# Patient Record
Sex: Female | Born: 1940 | Race: White | Hispanic: No | Marital: Married | Smoking: Former smoker
Health system: Southern US, Community
[De-identification: ages and names within clinical notes are randomized; demographics above are authoritative.]

## PROBLEM LIST (undated history)

## (undated) DIAGNOSIS — K219 Gastro-esophageal reflux disease without esophagitis: Secondary | ICD-10-CM

## (undated) DIAGNOSIS — I671 Cerebral aneurysm, nonruptured: Secondary | ICD-10-CM

## (undated) DIAGNOSIS — E785 Hyperlipidemia, unspecified: Secondary | ICD-10-CM

## (undated) DIAGNOSIS — G4700 Insomnia, unspecified: Secondary | ICD-10-CM

## (undated) DIAGNOSIS — J449 Chronic obstructive pulmonary disease, unspecified: Secondary | ICD-10-CM

## (undated) DIAGNOSIS — D751 Secondary polycythemia: Secondary | ICD-10-CM

## (undated) HISTORY — PX: TONSILLECTOMY: SUR1361

## (undated) HISTORY — PX: APPENDECTOMY: SHX54

## (undated) HISTORY — PX: BRAIN SURGERY: SHX531

---

## 2004-10-20 HISTORY — PX: BRAIN SURGERY: SHX531

## 2014-10-30 DIAGNOSIS — I671 Cerebral aneurysm, nonruptured: Secondary | ICD-10-CM | POA: Diagnosis not present

## 2014-10-30 DIAGNOSIS — D751 Secondary polycythemia: Secondary | ICD-10-CM | POA: Diagnosis not present

## 2014-10-30 DIAGNOSIS — K219 Gastro-esophageal reflux disease without esophagitis: Secondary | ICD-10-CM | POA: Diagnosis not present

## 2014-10-30 DIAGNOSIS — F5104 Psychophysiologic insomnia: Secondary | ICD-10-CM | POA: Diagnosis not present

## 2014-12-17 DIAGNOSIS — H10012 Acute follicular conjunctivitis, left eye: Secondary | ICD-10-CM | POA: Diagnosis not present

## 2014-12-21 DIAGNOSIS — J0181 Other acute recurrent sinusitis: Secondary | ICD-10-CM | POA: Diagnosis not present

## 2014-12-21 DIAGNOSIS — I671 Cerebral aneurysm, nonruptured: Secondary | ICD-10-CM | POA: Diagnosis not present

## 2014-12-21 DIAGNOSIS — R51 Headache: Secondary | ICD-10-CM | POA: Diagnosis not present

## 2014-12-21 DIAGNOSIS — Z720 Tobacco use: Secondary | ICD-10-CM | POA: Diagnosis not present

## 2015-01-01 ENCOUNTER — Ambulatory Visit: Payer: Self-pay | Admitting: Internal Medicine

## 2015-01-01 DIAGNOSIS — R51 Headache: Secondary | ICD-10-CM | POA: Diagnosis not present

## 2015-01-01 DIAGNOSIS — G501 Atypical facial pain: Secondary | ICD-10-CM | POA: Diagnosis not present

## 2015-01-01 DIAGNOSIS — I679 Cerebrovascular disease, unspecified: Secondary | ICD-10-CM | POA: Diagnosis not present

## 2015-05-15 DIAGNOSIS — F5104 Psychophysiologic insomnia: Secondary | ICD-10-CM | POA: Diagnosis not present

## 2015-05-15 DIAGNOSIS — F329 Major depressive disorder, single episode, unspecified: Secondary | ICD-10-CM | POA: Diagnosis not present

## 2015-05-15 DIAGNOSIS — E780 Pure hypercholesterolemia: Secondary | ICD-10-CM | POA: Diagnosis not present

## 2015-05-15 DIAGNOSIS — Z720 Tobacco use: Secondary | ICD-10-CM | POA: Diagnosis not present

## 2015-05-15 DIAGNOSIS — I671 Cerebral aneurysm, nonruptured: Secondary | ICD-10-CM | POA: Diagnosis not present

## 2015-11-20 DIAGNOSIS — F5104 Psychophysiologic insomnia: Secondary | ICD-10-CM | POA: Diagnosis not present

## 2015-11-20 DIAGNOSIS — Z299 Encounter for prophylactic measures, unspecified: Secondary | ICD-10-CM | POA: Diagnosis not present

## 2015-11-20 DIAGNOSIS — K219 Gastro-esophageal reflux disease without esophagitis: Secondary | ICD-10-CM | POA: Diagnosis not present

## 2015-11-20 DIAGNOSIS — I671 Cerebral aneurysm, nonruptured: Secondary | ICD-10-CM | POA: Diagnosis not present

## 2015-11-20 DIAGNOSIS — F17200 Nicotine dependence, unspecified, uncomplicated: Secondary | ICD-10-CM | POA: Diagnosis not present

## 2015-11-20 DIAGNOSIS — E7800 Pure hypercholesterolemia, unspecified: Secondary | ICD-10-CM | POA: Diagnosis not present

## 2015-11-20 DIAGNOSIS — Z800 Family history of malignant neoplasm of digestive organs: Secondary | ICD-10-CM | POA: Diagnosis not present

## 2016-06-04 DIAGNOSIS — I671 Cerebral aneurysm, nonruptured: Secondary | ICD-10-CM | POA: Diagnosis not present

## 2016-06-04 DIAGNOSIS — F17200 Nicotine dependence, unspecified, uncomplicated: Secondary | ICD-10-CM | POA: Diagnosis not present

## 2016-06-04 DIAGNOSIS — Z299 Encounter for prophylactic measures, unspecified: Secondary | ICD-10-CM | POA: Diagnosis not present

## 2016-06-04 DIAGNOSIS — F5104 Psychophysiologic insomnia: Secondary | ICD-10-CM | POA: Diagnosis not present

## 2016-06-04 DIAGNOSIS — E7800 Pure hypercholesterolemia, unspecified: Secondary | ICD-10-CM | POA: Diagnosis not present

## 2016-06-04 DIAGNOSIS — K219 Gastro-esophageal reflux disease without esophagitis: Secondary | ICD-10-CM | POA: Diagnosis not present

## 2016-06-11 DIAGNOSIS — F17200 Nicotine dependence, unspecified, uncomplicated: Secondary | ICD-10-CM | POA: Diagnosis not present

## 2016-06-11 DIAGNOSIS — I671 Cerebral aneurysm, nonruptured: Secondary | ICD-10-CM | POA: Diagnosis not present

## 2016-06-11 DIAGNOSIS — Z0000 Encounter for general adult medical examination without abnormal findings: Secondary | ICD-10-CM | POA: Diagnosis not present

## 2016-06-11 DIAGNOSIS — K219 Gastro-esophageal reflux disease without esophagitis: Secondary | ICD-10-CM | POA: Diagnosis not present

## 2016-06-11 DIAGNOSIS — F5104 Psychophysiologic insomnia: Secondary | ICD-10-CM | POA: Diagnosis not present

## 2016-06-11 DIAGNOSIS — E782 Mixed hyperlipidemia: Secondary | ICD-10-CM | POA: Diagnosis not present

## 2016-08-15 ENCOUNTER — Encounter: Payer: Self-pay | Admitting: Emergency Medicine

## 2016-08-15 ENCOUNTER — Emergency Department: Payer: Commercial Managed Care - HMO

## 2016-08-15 ENCOUNTER — Emergency Department
Admission: EM | Admit: 2016-08-15 | Discharge: 2016-08-15 | Disposition: A | Discharge status: Home / Self Care | Payer: Commercial Managed Care - HMO | Attending: Student in an Organized Health Care Education/Training Program | Admitting: Student in an Organized Health Care Education/Training Program

## 2016-08-15 DIAGNOSIS — Z79899 Other long term (current) drug therapy: Secondary | ICD-10-CM | POA: Insufficient documentation

## 2016-08-15 DIAGNOSIS — F17210 Nicotine dependence, cigarettes, uncomplicated: Secondary | ICD-10-CM | POA: Insufficient documentation

## 2016-08-15 DIAGNOSIS — N644 Mastodynia: Secondary | ICD-10-CM | POA: Diagnosis not present

## 2016-08-15 DIAGNOSIS — Z7982 Long term (current) use of aspirin: Secondary | ICD-10-CM | POA: Insufficient documentation

## 2016-08-15 DIAGNOSIS — R0789 Other chest pain: Secondary | ICD-10-CM | POA: Diagnosis not present

## 2016-08-15 DIAGNOSIS — R918 Other nonspecific abnormal finding of lung field: Secondary | ICD-10-CM | POA: Diagnosis not present

## 2016-08-15 HISTORY — DX: Gastro-esophageal reflux disease without esophagitis: K21.9

## 2016-08-15 HISTORY — DX: Hyperlipidemia, unspecified: E78.5

## 2016-08-15 LAB — CBC WITH DIFFERENTIAL/PLATELET
BASOS PCT: 0 %
Basophils Absolute: 0.1 10*3/uL (ref 0–0.1)
EOS ABS: 0.1 10*3/uL (ref 0–0.7)
EOS PCT: 1 %
HCT: 52.9 % — ABNORMAL HIGH (ref 35.0–47.0)
Hemoglobin: 18 g/dL — ABNORMAL HIGH (ref 12.0–16.0)
LYMPHS ABS: 2.9 10*3/uL (ref 1.0–3.6)
Lymphocytes Relative: 18 %
MCH: 30.8 pg (ref 26.0–34.0)
MCHC: 34 g/dL (ref 32.0–36.0)
MCV: 90.5 fL (ref 80.0–100.0)
MONO ABS: 1.3 10*3/uL — AB (ref 0.2–0.9)
MONOS PCT: 8 %
NEUTROS PCT: 73 %
Neutro Abs: 11.5 10*3/uL — ABNORMAL HIGH (ref 1.4–6.5)
PLATELETS: 233 10*3/uL (ref 150–440)
RBC: 5.84 MIL/uL — ABNORMAL HIGH (ref 3.80–5.20)
RDW: 14.1 % (ref 11.5–14.5)
WBC: 16 10*3/uL — ABNORMAL HIGH (ref 3.6–11.0)

## 2016-08-15 LAB — COMPREHENSIVE METABOLIC PANEL
ALBUMIN: 3.9 g/dL (ref 3.5–5.0)
ALK PHOS: 63 U/L (ref 38–126)
ALT: 15 U/L (ref 14–54)
ANION GAP: 10 (ref 5–15)
AST: 24 U/L (ref 15–41)
BUN: 18 mg/dL (ref 6–20)
CALCIUM: 9.2 mg/dL (ref 8.9–10.3)
CO2: 23 mmol/L (ref 22–32)
Chloride: 104 mmol/L (ref 101–111)
Creatinine, Ser: 0.92 mg/dL (ref 0.44–1.00)
GFR calc non Af Amer: >60 mL/min (ref >60)
Glucose, Bld: 98 mg/dL (ref 65–99)
POTASSIUM: 4.4 mmol/L (ref 3.5–5.1)
SODIUM: 137 mmol/L (ref 135–145)
Total Bilirubin: 0.6 mg/dL (ref 0.3–1.2)
Total Protein: 7.4 g/dL (ref 6.5–8.1)

## 2016-08-15 LAB — TROPONIN I
Troponin I: <0.03 ng/mL (ref <0.03)
Troponin I: <0.03 ng/mL (ref <0.03)

## 2016-08-15 LAB — FIBRIN DERIVATIVES D-DIMER (ARMC ONLY): FIBRIN DERIVATIVES D-DIMER (ARMC): 405 (ref 0–499)

## 2016-08-15 MED ORDER — OXYCODONE HCL 5 MG PO TABS
5.0000 mg | ORAL_TABLET | ORAL | Status: DC | PRN
  Administered 2016-08-15: 5 mg via ORAL
  Filled 2016-08-15: qty 1

## 2016-08-15 MED ORDER — PROMETHAZINE HCL 25 MG/ML IJ SOLN
INTRAMUSCULAR | Status: AC
  Administered 2016-08-15: 12.5 mg via INTRAVENOUS
  Filled 2016-08-15: qty 1

## 2016-08-15 MED ORDER — TRAMADOL HCL 50 MG PO TABS: 50.0000 mg | ORAL_TABLET | Freq: Four times a day (QID) | ORAL | 0 refills | Status: AC | PRN

## 2016-08-15 MED ORDER — FENTANYL CITRATE (PF) 100 MCG/2ML IJ SOLN
50.0000 ug | INTRAMUSCULAR | Status: DC | PRN
  Administered 2016-08-15 (×2): 50 ug via INTRAVENOUS
  Filled 2016-08-15 (×2): qty 2

## 2016-08-15 MED ORDER — ONDANSETRON HCL 4 MG PO TABS: 4.0000 mg | ORAL_TABLET | Freq: Every day | ORAL | 0 refills | Status: AC | PRN

## 2016-08-15 MED ORDER — IOPAMIDOL (ISOVUE-300) INJECTION 61%
75.0000 mL | Freq: Once | INTRAVENOUS | Status: AC | PRN
  Administered 2016-08-15: 75 mL via INTRAVENOUS

## 2016-08-15 MED ORDER — PROMETHAZINE HCL 25 MG/ML IJ SOLN
12.5000 mg | Freq: Once | INTRAMUSCULAR | Status: AC
  Administered 2016-08-15: 12.5 mg via INTRAVENOUS

## 2016-08-15 MED ORDER — ACETAMINOPHEN 500 MG PO TABS
1000.0000 mg | ORAL_TABLET | Freq: Once | ORAL | Status: AC
  Administered 2016-08-15: 1000 mg via ORAL
  Filled 2016-08-15: qty 2

## 2016-08-15 NOTE — ED Triage Notes (Signed)
Pt c/o right breat pain started this am. States does not feel a lump.

## 2016-08-15 NOTE — ED Provider Notes (Signed)
[facility] Emergency Department Provider Note    First MD Initiated Contact with Patient 08/15/16 [phone]     (approximate)  I have reviewed the triage vital signs and the nursing notes.   HISTORY  Chief Complaint Breast Pain   Dictation #1 [id number]  [id number]:[id number]  HPI Kathryn Houston is a 75 y.o. female who presents with acute chest pain that is located primarily right breast associated with burning and stabbing pain that is waxing and waning and 7/10 in severity since 3 AM this morning. She's never had this type pain before. Denies any nipple redness or discharge. Denies any personal history of breast cancer. Denies any personal history of heart disease. There is no associated shortness of breath. No pain radiating to the back. No abdominal pain or nausea.   Past Medical History:  Diagnosis Date  . GERD (gastroesophageal reflux disease)   . Hyperlipidemia     There are no active problems to display for this patient.   Past Surgical History:  Procedure Laterality Date  . APPENDECTOMY    . BRAIN SURGERY      Prior to Admission medications   Medication Sig Start Date End Date Taking? Authorizing Provider  aspirin EC 81 MG tablet Take 1 tablet by mouth daily.   Yes Historical Provider, MD  CRANBERRY EXTRACT PO Take 1 tablet by mouth daily.   Yes Historical Provider, MD  pantoprazole (PROTONIX) 40 MG tablet Take 1 tablet by mouth 2 (two) times daily as needed. 08/12/16  Yes Historical Provider, MD  pravastatin (PRAVACHOL) 80 MG tablet Take 1 tablet by mouth daily. 08/12/16  Yes Historical Provider, MD  zolpidem (AMBIEN) 10 MG tablet Take 1 tablet by mouth at bedtime. 07/31/16  Yes Historical Provider, MD  ondansetron (ZOFRAN) 4 MG tablet Take 1 tablet (4 mg total) by mouth daily as needed for nausea or vomiting. 08/15/16 08/15/17  Willy EddyPatrick Rubert Frediani, MD  traMADol (ULTRAM) 50 MG tablet Take 1 tablet (50 mg total) by mouth every 6 (six) hours as  needed for severe pain. 08/15/16 08/15/17  Willy EddyPatrick Clair Bardwell, MD    Allergies Review of patient's allergies indicates no known allergies.  FMH:  Father with colonc cnacer  Social History Social History  Substance Use Topics  . Smoking status: Current Some Day Smoker  . Smokeless tobacco: Never Used  . Alcohol use No    Review of Systems Patient denies headaches, rhinorrhea, blurry vision, numbness, shortness of breath, chest pain, edema, cough, abdominal pain, nausea, vomiting, diarrhea, dysuria, fevers, rashes or hallucinations unless otherwise stated above in HPI. ____________________________________________   PHYSICAL EXAM:  VITAL SIGNS: Vitals:   08/15/16 1300 08/15/16 1338  BP: 106/74 133/67  Pulse:  68  Resp: 16 16  Temp:      Constitutional: Alert and oriented. Well appearing and in no acute distress. Eyes: Conjunctivae are normal. PERRL. EOMI. Head: Atraumatic. Nose: No congestion/rhinnorhea. Mouth/Throat: Mucous membranes are moist.  Oropharynx non-erythematous. Neck: No stridor. Painless ROM. No cervical spine tenderness to palpation Hematological/Lymphatic/Immunilogical: No cervical lymphadenopathy. Cardiovascular: Normal rate, regular rhythm. Grossly normal heart sounds.  Good peripheral circulation. Right breath without tender mass or fluctuance, no peau d' orange, no cellulitis. There is an area of tender at 3 o'clock 2cm lateral from nipple Respiratory: Normal respiratory effort.  No retractions. Lungs CTAB. Gastrointestinal: Soft and nontender. No distention. No abdominal bruits. No CVA tenderness. Genitourinary:  Musculoskeletal: No lower extremity tenderness nor edema.  No joint effusions. Neurologic:  Normal speech and  language. No gross focal neurologic deficits are appreciated. No gait instability. Skin:  Skin is warm, dry and intact. No rash noted. Psychiatric: Mood and affect are normal. Speech and behavior are  normal.  ____________________________________________   LABS (all labs ordered are listed, but only abnormal results are displayed)  Results for orders placed or performed during the hospital encounter of 08/15/16 (from the past 24 hour(s))  CBC with Differential/Platelet     Status: Abnormal   Collection Time: 08/15/16  8:18 AM  Result Value Ref Range   WBC 16.0 (H) 3.6 - 11.0 K/uL   RBC 5.84 (H) 3.80 - 5.20 MIL/uL   Hemoglobin 18.0 (H) 12.0 - 16.0 g/dL   HCT 16.1 (H) 09.6 - 04.5 %   MCV 90.5 80.0 - 100.0 fL   MCH 30.8 26.0 - 34.0 pg   MCHC 34.0 32.0 - 36.0 g/dL   RDW 40.9 81.1 - 91.4 %   Platelets 233 150 - 440 K/uL   Neutrophils Relative % 73 %   Neutro Abs 11.5 (H) 1.4 - 6.5 K/uL   Lymphocytes Relative 18 %   Lymphs Abs 2.9 1.0 - 3.6 K/uL   Monocytes Relative 8 %   Monocytes Absolute 1.3 (H) 0.2 - 0.9 K/uL   Eosinophils Relative 1 %   Eosinophils Absolute 0.1 0 - 0.7 K/uL   Basophils Relative 0 %   Basophils Absolute 0.1 0 - 0.1 K/uL  Comprehensive metabolic panel     Status: None   Collection Time: 08/15/16  8:18 AM  Result Value Ref Range   Sodium 137 135 - 145 mmol/L   Potassium 4.4 3.5 - 5.1 mmol/L   Chloride 104 101 - 111 mmol/L   CO2 23 22 - 32 mmol/L   Glucose, Bld 98 65 - 99 mg/dL   BUN 18 6 - 20 mg/dL   Creatinine, Ser 7.82 0.44 - 1.00 mg/dL   Calcium 9.2 8.9 - 95.6 mg/dL   Total Protein 7.4 6.5 - 8.1 g/dL   Albumin 3.9 3.5 - 5.0 g/dL   AST 24 15 - 41 U/L   ALT 15 14 - 54 U/L   Alkaline Phosphatase 63 38 - 126 U/L   Total Bilirubin 0.6 0.3 - 1.2 mg/dL   GFR calc non Af Amer >60 >60 mL/min   GFR calc Af Amer >60 >60 mL/min   Anion gap 10 5 - 15  Troponin I     Status: None   Collection Time: 08/15/16  8:18 AM  Result Value Ref Range   Troponin I <0.03 <0.03 ng/mL  Fibrin derivatives D-Dimer ([facility] only)     Status: None   Collection Time: 08/15/16  8:18 AM  Result Value Ref Range   Fibrin derivatives D-dimer ([facility]) 405 0 - 499  Troponin I      Status: None   Collection Time: 08/15/16 11:30 AM  Result Value Ref Range   Troponin I <0.03 <0.03 ng/mL   ____________________________________________  EKG My review and personal interpretation at Time: 8:14   Indication: chest pain  Rate: 85  Rhythm: sinus3 Axis: normal Other: no acute st changes, poor r wave progression ____________________________________________  RADIOLOGY  I personally reviewed all radiographic images ordered to evaluate for the above acute complaints and reviewed radiology reports and findings.  These findings were personally discussed with the patient.  Please see medical record for radiology report.  ____________________________________________   PROCEDURES  Procedure(s) performed: none    Critical Care performed: no ____________________________________________  INITIAL IMPRESSION / ASSESSMENT AND PLAN / ED COURSE  Pertinent labs & imaging results that were available during my care of the patient were reviewed by me and considered in my medical decision making (see chart for details).  DDX: mass, mastitis, abscess, acs, pe, ptx, pna, msk strain  [address] [place] is a 75 y.o. who presents to the ED with acute right breast pain. She arrives afebrile mildly tachycardic but in no respiratory distress. Based on risk factors above differential has been considered. No evidence of significant breast mass or infection. I am concerned for possible referred pain from cardiac etiology and also possible underlying pulmonary embolism.  Will order laboratory evaluation shows EKG, place patient on monitor, order d-dimer to further risk stratify for PE as the patient is otherwise low risk wells criteria and order CT imaging of the chest to evaluate for for acute pathology.  The patient will be placed on continuous pulse oximetry and telemetry for monitoring.  Laboratory evaluation will be sent to evaluate for the above complaints.  Clinical Course  Comment By  Time  Patient reassessed. Discussed findings with patient. Pain does seem very localized to the right breast. Possible early shingles without rash. Saline no evidence of abscess or cellulitis. She remains afebrile. EKG without evidence of acute ischemia and troponin is negative. Will further risk stratify with repeat troponin. Willy Eddy, MD 10/27 1121  Patient with small amount of emesis after roxicodone. Will provide antiemetic. Willy Eddy, MD 10/27 1236  Patient's repeat troponin came back normal. Her blood work is otherwise reassuring. She remains hemodynamic stable. Did have some improvement with oral Roxicodone. She has no fever. No better explanation for leukocytosis other than possible dehydration the patient is tolerating oral hydration at this time. Do not feel antibiotics clinically indicated at this time as is no evidence of significant cellulitis or abscess. As the patient is otherwise well-appearing and in no distress at this point we will discharge with a symptomatically management and follow-up with primary care provider. Discussed strict return precautions.  Have discussed with the patient and available family all diagnostics and treatments performed thus far and all questions were answered to the best of my ability. The patient demonstrates understanding and agreement with plan.  Willy Eddy, MD 10/27 1302     ____________________________________________   FINAL CLINICAL IMPRESSION(S) / ED DIAGNOSES  Final diagnoses:  Breast pain in female      NEW MEDICATIONS STARTED DURING THIS VISIT:  Discharge Medication List as of 08/15/2016  1:12 PM    START taking these medications   Details  ondansetron (ZOFRAN) 4 MG tablet Take 1 tablet (4 mg total) by mouth daily as needed for nausea or vomiting., Starting Fri 08/15/2016, Until Sat 08/15/2017, Print    traMADol (ULTRAM) 50 MG tablet Take 1 tablet (50 mg total) by mouth every 6 (six) hours as needed for severe  pain., Starting Fri 08/15/2016, Until Sat 08/15/2017, Print         Note:  This document was prepared using Dragon voice recognition software and may include unintentional dictation errors.    Willy Eddy, MD 08/15/16 [phone]

## 2016-08-15 NOTE — ED Notes (Signed)
\  [id number]\

## 2016-08-15 NOTE — ED Notes (Signed)
Right breast pain, burning and stabbing in nature that began at 0300AM today. Pt denies discharge from nipple or redness to skin. Pt alert and oriented X4, active, cooperative, pt in NAD. RR even and unlabored, color WNL.  Denies chest wall pain.

## 2016-12-05 DIAGNOSIS — E782 Mixed hyperlipidemia: Secondary | ICD-10-CM | POA: Diagnosis not present

## 2016-12-05 DIAGNOSIS — Z0000 Encounter for general adult medical examination without abnormal findings: Secondary | ICD-10-CM | POA: Diagnosis not present

## 2016-12-05 DIAGNOSIS — I671 Cerebral aneurysm, nonruptured: Secondary | ICD-10-CM | POA: Diagnosis not present

## 2016-12-05 DIAGNOSIS — F5104 Psychophysiologic insomnia: Secondary | ICD-10-CM | POA: Diagnosis not present

## 2016-12-05 DIAGNOSIS — F17200 Nicotine dependence, unspecified, uncomplicated: Secondary | ICD-10-CM | POA: Diagnosis not present

## 2016-12-05 DIAGNOSIS — K219 Gastro-esophageal reflux disease without esophagitis: Secondary | ICD-10-CM | POA: Diagnosis not present

## 2016-12-15 DIAGNOSIS — E782 Mixed hyperlipidemia: Secondary | ICD-10-CM | POA: Diagnosis not present

## 2016-12-15 DIAGNOSIS — K219 Gastro-esophageal reflux disease without esophagitis: Secondary | ICD-10-CM | POA: Diagnosis not present

## 2016-12-15 DIAGNOSIS — D751 Secondary polycythemia: Secondary | ICD-10-CM | POA: Diagnosis not present

## 2016-12-15 DIAGNOSIS — F17200 Nicotine dependence, unspecified, uncomplicated: Secondary | ICD-10-CM | POA: Diagnosis not present

## 2016-12-15 DIAGNOSIS — F5104 Psychophysiologic insomnia: Secondary | ICD-10-CM | POA: Diagnosis not present

## 2016-12-15 DIAGNOSIS — Z0000 Encounter for general adult medical examination without abnormal findings: Secondary | ICD-10-CM | POA: Diagnosis not present

## 2016-12-15 DIAGNOSIS — I671 Cerebral aneurysm, nonruptured: Secondary | ICD-10-CM | POA: Diagnosis not present

## 2017-01-27 DIAGNOSIS — I671 Cerebral aneurysm, nonruptured: Secondary | ICD-10-CM | POA: Diagnosis not present

## 2017-01-27 DIAGNOSIS — H539 Unspecified visual disturbance: Secondary | ICD-10-CM | POA: Diagnosis not present

## 2017-01-27 DIAGNOSIS — F418 Other specified anxiety disorders: Secondary | ICD-10-CM | POA: Diagnosis not present

## 2017-01-27 DIAGNOSIS — E782 Mixed hyperlipidemia: Secondary | ICD-10-CM | POA: Diagnosis not present

## 2017-01-27 DIAGNOSIS — D751 Secondary polycythemia: Secondary | ICD-10-CM | POA: Diagnosis not present

## 2017-01-28 DIAGNOSIS — H2513 Age-related nuclear cataract, bilateral: Secondary | ICD-10-CM | POA: Diagnosis not present

## 2017-03-23 DIAGNOSIS — I671 Cerebral aneurysm, nonruptured: Secondary | ICD-10-CM | POA: Diagnosis not present

## 2017-03-23 DIAGNOSIS — K219 Gastro-esophageal reflux disease without esophagitis: Secondary | ICD-10-CM | POA: Diagnosis not present

## 2017-03-23 DIAGNOSIS — E782 Mixed hyperlipidemia: Secondary | ICD-10-CM | POA: Diagnosis not present

## 2017-03-23 DIAGNOSIS — F17200 Nicotine dependence, unspecified, uncomplicated: Secondary | ICD-10-CM | POA: Diagnosis not present

## 2017-03-23 DIAGNOSIS — D751 Secondary polycythemia: Secondary | ICD-10-CM | POA: Diagnosis not present

## 2017-10-21 DIAGNOSIS — F17200 Nicotine dependence, unspecified, uncomplicated: Secondary | ICD-10-CM | POA: Diagnosis not present

## 2017-10-21 DIAGNOSIS — E782 Mixed hyperlipidemia: Secondary | ICD-10-CM | POA: Diagnosis not present

## 2017-10-21 DIAGNOSIS — I671 Cerebral aneurysm, nonruptured: Secondary | ICD-10-CM | POA: Diagnosis not present

## 2017-10-21 DIAGNOSIS — K219 Gastro-esophageal reflux disease without esophagitis: Secondary | ICD-10-CM | POA: Diagnosis not present

## 2017-10-21 DIAGNOSIS — D751 Secondary polycythemia: Secondary | ICD-10-CM | POA: Diagnosis not present

## 2017-10-27 DIAGNOSIS — I671 Cerebral aneurysm, nonruptured: Secondary | ICD-10-CM | POA: Diagnosis not present

## 2017-10-27 DIAGNOSIS — K219 Gastro-esophageal reflux disease without esophagitis: Secondary | ICD-10-CM | POA: Diagnosis not present

## 2017-10-27 DIAGNOSIS — E782 Mixed hyperlipidemia: Secondary | ICD-10-CM | POA: Diagnosis not present

## 2017-10-27 DIAGNOSIS — F5104 Psychophysiologic insomnia: Secondary | ICD-10-CM | POA: Diagnosis not present

## 2017-10-27 DIAGNOSIS — Z0000 Encounter for general adult medical examination without abnormal findings: Secondary | ICD-10-CM | POA: Diagnosis not present

## 2017-10-27 DIAGNOSIS — Z87891 Personal history of nicotine dependence: Secondary | ICD-10-CM | POA: Diagnosis not present

## 2018-04-23 DIAGNOSIS — I671 Cerebral aneurysm, nonruptured: Secondary | ICD-10-CM | POA: Diagnosis not present

## 2018-04-23 DIAGNOSIS — K219 Gastro-esophageal reflux disease without esophagitis: Secondary | ICD-10-CM | POA: Diagnosis not present

## 2018-04-23 DIAGNOSIS — Z87891 Personal history of nicotine dependence: Secondary | ICD-10-CM | POA: Diagnosis not present

## 2018-04-23 DIAGNOSIS — E782 Mixed hyperlipidemia: Secondary | ICD-10-CM | POA: Diagnosis not present

## 2018-04-23 DIAGNOSIS — Z0000 Encounter for general adult medical examination without abnormal findings: Secondary | ICD-10-CM | POA: Diagnosis not present

## 2018-04-23 DIAGNOSIS — F5104 Psychophysiologic insomnia: Secondary | ICD-10-CM | POA: Diagnosis not present

## 2018-04-28 DIAGNOSIS — I671 Cerebral aneurysm, nonruptured: Secondary | ICD-10-CM | POA: Diagnosis not present

## 2018-04-28 DIAGNOSIS — K219 Gastro-esophageal reflux disease without esophagitis: Secondary | ICD-10-CM | POA: Diagnosis not present

## 2018-04-28 DIAGNOSIS — E782 Mixed hyperlipidemia: Secondary | ICD-10-CM | POA: Diagnosis not present

## 2018-04-28 DIAGNOSIS — Z0000 Encounter for general adult medical examination without abnormal findings: Secondary | ICD-10-CM | POA: Diagnosis not present

## 2018-04-28 DIAGNOSIS — R399 Unspecified symptoms and signs involving the genitourinary system: Secondary | ICD-10-CM | POA: Diagnosis not present

## 2018-04-28 DIAGNOSIS — D751 Secondary polycythemia: Secondary | ICD-10-CM | POA: Diagnosis not present

## 2018-04-28 DIAGNOSIS — R350 Frequency of micturition: Secondary | ICD-10-CM | POA: Diagnosis not present

## 2018-04-28 DIAGNOSIS — R8290 Unspecified abnormal findings in urine: Secondary | ICD-10-CM | POA: Diagnosis not present

## 2018-04-28 DIAGNOSIS — F5104 Psychophysiologic insomnia: Secondary | ICD-10-CM | POA: Diagnosis not present

## 2018-06-04 DIAGNOSIS — R399 Unspecified symptoms and signs involving the genitourinary system: Secondary | ICD-10-CM | POA: Diagnosis not present

## 2018-06-04 DIAGNOSIS — R8290 Unspecified abnormal findings in urine: Secondary | ICD-10-CM | POA: Diagnosis not present

## 2018-06-04 DIAGNOSIS — E782 Mixed hyperlipidemia: Secondary | ICD-10-CM | POA: Diagnosis not present

## 2018-06-04 DIAGNOSIS — F17200 Nicotine dependence, unspecified, uncomplicated: Secondary | ICD-10-CM | POA: Diagnosis not present

## 2018-06-04 DIAGNOSIS — F5104 Psychophysiologic insomnia: Secondary | ICD-10-CM | POA: Diagnosis not present

## 2018-06-04 DIAGNOSIS — I671 Cerebral aneurysm, nonruptured: Secondary | ICD-10-CM | POA: Diagnosis not present

## 2018-06-04 DIAGNOSIS — D751 Secondary polycythemia: Secondary | ICD-10-CM | POA: Diagnosis not present

## 2018-06-07 DIAGNOSIS — D751 Secondary polycythemia: Secondary | ICD-10-CM | POA: Diagnosis not present

## 2018-06-07 DIAGNOSIS — R399 Unspecified symptoms and signs involving the genitourinary system: Secondary | ICD-10-CM | POA: Diagnosis not present

## 2018-06-08 DIAGNOSIS — R399 Unspecified symptoms and signs involving the genitourinary system: Secondary | ICD-10-CM | POA: Diagnosis not present

## 2018-06-08 DIAGNOSIS — R8290 Unspecified abnormal findings in urine: Secondary | ICD-10-CM | POA: Diagnosis not present

## 2018-06-08 DIAGNOSIS — D751 Secondary polycythemia: Secondary | ICD-10-CM | POA: Diagnosis not present

## 2018-06-13 DIAGNOSIS — D751 Secondary polycythemia: Secondary | ICD-10-CM | POA: Insufficient documentation

## 2018-06-13 NOTE — Progress Notes (Signed)
Kathryn Houston  Telephone:(336) [phone] Fax:(336) [phone]  ID: Kathryn BurnsCamille M Houston OB: May 23, 1941  MR#: [id number]  ION#:[id number]#:[id number]  Patient Care Team: Kathryn ReichmannHande, Vishwanath, MD as PCP - General (Internal Medicine)  CHIEF COMPLAINT: Polycythemia  INTERVAL HISTORY: Patient is a 77 year old female who was noted to have a persistently elevated hemoglobin on routine blood work.  She currently feels well and is asymptomatic.  She has no neurologic complaints.  She denies any recent fevers or illnesses.  She has a good appetite and denies weight loss.  She has no chest pain or shortness of breath.  She denies any nausea, vomiting, constipation, or diarrhea.  She has no urinary complaints.  Patient feels at her baseline offers no specific complaints today.  REVIEW OF SYSTEMS:   Review of Systems  Constitutional: Negative.  Negative for fever, malaise/fatigue and weight loss.  Respiratory: Negative.  Negative for cough, hemoptysis and shortness of breath.   Cardiovascular: Negative.  Negative for chest pain and leg swelling.  Gastrointestinal: Negative.  Negative for abdominal pain.  Genitourinary: Negative.  Negative for dysuria.  Musculoskeletal: Negative.  Negative for back pain.  Skin: Negative.  Negative for rash.  Neurological: Negative.  Negative for focal weakness, weakness and headaches.  Psychiatric/Behavioral: Negative.  The patient is not nervous/anxious.     As per HPI. Otherwise, a complete review of systems is negative.  PAST MEDICAL HISTORY: Past Medical History:  Diagnosis Date  . GERD (gastroesophageal reflux disease)   . Hyperlipidemia     PAST SURGICAL HISTORY: Past Surgical History:  Procedure Laterality Date  . APPENDECTOMY    . BRAIN SURGERY      FAMILY HISTORY: History reviewed. No pertinent family history.  ADVANCED DIRECTIVES (Y/N):  N  HEALTH MAINTENANCE: Social History   Tobacco Use  . Smoking status: Current Some Day Smoker  .  Smokeless tobacco: Never Used  Substance Use Topics  . Alcohol use: No    Frequency: Never  . Drug use: Never     Colonoscopy:  PAP:  Bone density:  Lipid panel:  No Known Allergies  Current Outpatient Medications  Medication Sig Dispense Refill  . aspirin EC 81 MG tablet Take 1 tablet by mouth daily.    Marland Kitchen. CRANBERRY EXTRACT PO Take 1 tablet by mouth daily.    . pantoprazole (PROTONIX) 40 MG tablet Take 1 tablet by mouth 2 (two) times daily as needed.    . pravastatin (PRAVACHOL) 80 MG tablet Take 1 tablet by mouth daily.    Marland Kitchen. zolpidem (AMBIEN) 10 MG tablet Take 1 tablet by mouth at bedtime.     No current facility-administered medications for this visit.     OBJECTIVE: Vitals:   06/15/18 1518 06/15/18 1530  BP:  125/78  Pulse:  79  Resp: 12   Temp:  (!) 97.5 F (36.4 C)     Body mass index is 28.15 kg/m.    ECOG FS:0 - Asymptomatic  General: Well-developed, well-nourished, no acute distress. Eyes: Pink conjunctiva, anicteric sclera. HEENT: Normocephalic, moist mucous membranes, clear oropharnyx. Lungs: Clear to auscultation bilaterally. Heart: Regular rate and rhythm. No rubs, murmurs, or gallops. Abdomen: Soft, nontender, nondistended. No organomegaly noted, normoactive bowel sounds. Musculoskeletal: No edema, cyanosis, or clubbing. Neuro: Alert, answering all questions appropriately. Cranial nerves grossly intact. Skin: No rashes or petechiae noted. Psych: Normal affect. Lymphatics: No cervical, calvicular, axillary or inguinal LAD.   LAB RESULTS:  Lab Results  Component Value Date   NA 137 08/15/2016   K  4.4 08/15/2016   CL 104 08/15/2016   CO2 23 08/15/2016   GLUCOSE 98 08/15/2016   BUN 18 08/15/2016   CREATININE 0.92 08/15/2016   CALCIUM 9.2 08/15/2016   PROT 7.4 08/15/2016   ALBUMIN 3.9 08/15/2016   AST 24 08/15/2016   ALT 15 08/15/2016   ALKPHOS 63 08/15/2016   BILITOT 0.6 08/15/2016   GFRNONAA >60 08/15/2016   GFRAA >60 08/15/2016    Lab  Results  Component Value Date   WBC 10.7 06/15/2018   NEUTROABS 11.5 (H) 08/15/2016   HGB 17.2 (H) 06/15/2018   HCT 50.9 (H) 06/15/2018   MCV 91.3 06/15/2018   PLT 227 06/15/2018     STUDIES: No results found.  ASSESSMENT: Polycythemia  PLAN:    1. 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  Patient has an elevated carbon monoxide level of 7.3%.  Her iron stores, erythropoietin level, and JAK-2 mutation are all either negative or within normal limits.  Hemoglobin has trended down slightly and is now 17.2.  Return to clinic in 1 month with repeat laboratory work and further evaluation.  If her hemoglobin remains greater than 17 at that time, will consider phlebotomy. 2.  Tobacco use: Patient expressed interest in decreasing use and plans to further discuss with her primary care physician.  She also may benefit from referral to [place] lung screening program.  I spent a total of 45 minutes face-to-face with the patient of which greater than 50% of the visit was spent in counseling and coordination of care as detailed above.   Patient expressed understanding and was in agreement with this plan. She also understands that She can call clinic at any time with any questions, concerns, or complaints.   Cancer Staging No matching staging information was found for the patient.  Jeralyn Ruths, MD   06/19/2018 7:30 AM

## 2018-06-15 ENCOUNTER — Encounter (INDEPENDENT_AMBULATORY_CARE_PROVIDER_SITE_OTHER): Payer: Self-pay

## 2018-06-15 ENCOUNTER — Inpatient Hospital Stay: Payer: Medicare HMO

## 2018-06-15 ENCOUNTER — Inpatient Hospital Stay: Payer: Medicare HMO | Attending: Oncology | Admitting: Oncology

## 2018-06-15 ENCOUNTER — Other Ambulatory Visit: Payer: Self-pay

## 2018-06-15 ENCOUNTER — Encounter: Payer: Self-pay | Admitting: Oncology

## 2018-06-15 DIAGNOSIS — D751 Secondary polycythemia: Secondary | ICD-10-CM

## 2018-06-15 DIAGNOSIS — F17210 Nicotine dependence, cigarettes, uncomplicated: Secondary | ICD-10-CM

## 2018-06-15 LAB — CBC
HCT: 50.9 % — ABNORMAL HIGH (ref 35.0–47.0)
HEMOGLOBIN: 17.2 g/dL — AB (ref 12.0–16.0)
MCH: 30.8 pg (ref 26.0–34.0)
MCHC: 33.7 g/dL (ref 32.0–36.0)
MCV: 91.3 fL (ref 80.0–100.0)
PLATELETS: 227 10*3/uL (ref 150–440)
RBC: 5.57 MIL/uL — ABNORMAL HIGH (ref 3.80–5.20)
RDW: 14.5 % (ref 11.5–14.5)
WBC: 10.7 10*3/uL (ref 3.6–11.0)

## 2018-06-15 LAB — IRON AND TIBC
IRON: 69 ug/dL (ref 28–170)
SATURATION RATIOS: 19 % (ref 10.4–31.8)
TIBC: 356 ug/dL (ref 250–450)
UIBC: 287 ug/dL

## 2018-06-15 LAB — FERRITIN: Ferritin: 29 ng/mL (ref 11–307)

## 2018-06-15 NOTE — Progress Notes (Signed)
Patient here for initial visit. She has been feeling light headed and pressure at the base of her skull for about a week. She has also been having a burning feeling in her lower legs.

## 2018-06-16 LAB — ERYTHROPOIETIN: Erythropoietin: 13.6 m[IU]/mL (ref 2.6–18.5)

## 2018-06-16 LAB — CARBON MONOXIDE, BLOOD (PERFORMED AT REF LAB): Carbon Monoxide, Blood: 7.3 % — ABNORMAL HIGH (ref 0.0–3.6)

## 2018-06-18 LAB — JAK2 GENOTYPR

## 2018-06-22 DIAGNOSIS — H2513 Age-related nuclear cataract, bilateral: Secondary | ICD-10-CM | POA: Diagnosis not present

## 2018-06-25 ENCOUNTER — Other Ambulatory Visit: Payer: Self-pay | Admitting: Internal Medicine

## 2018-06-25 DIAGNOSIS — R51 Headache: Principal | ICD-10-CM

## 2018-06-25 DIAGNOSIS — R519 Headache, unspecified: Secondary | ICD-10-CM

## 2018-06-25 DIAGNOSIS — R42 Dizziness and giddiness: Secondary | ICD-10-CM

## 2018-06-26 ENCOUNTER — Ambulatory Visit
Admission: RE | Admit: 2018-06-26 | Discharge: 2018-06-26 | Disposition: A | Discharge status: Home / Self Care | Payer: Medicare HMO | Source: Ambulatory Visit | Attending: Internal Medicine | Admitting: Internal Medicine

## 2018-06-26 DIAGNOSIS — R42 Dizziness and giddiness: Secondary | ICD-10-CM | POA: Diagnosis not present

## 2018-06-26 DIAGNOSIS — R519 Headache, unspecified: Secondary | ICD-10-CM

## 2018-06-26 DIAGNOSIS — R51 Headache: Secondary | ICD-10-CM | POA: Insufficient documentation

## 2018-07-11 NOTE — Progress Notes (Signed)
[facility]  Telephone:(336) [phone] Fax:(336) [phone]  ID: Kathryn BurnsCamille M Houston OB: 1941-09-17  MR#: [id number]  QIO#:[id number]#:[id number]  Patient Care Team: Barbette ReichmannHande, Vishwanath, MD as PCP - General (Internal Medicine)  CHIEF COMPLAINT: Polycythemia, secondary  INTERVAL HISTORY: Patient returns to clinic today for repeat laboratory work and further evaluation.  She continues to smoke heavily, but states that she has cut back significantly over the past month.  She currently feels well and is asymptomatic.  She has no neurologic complaints.  She denies any recent fevers or illnesses.  She has a good appetite and denies weight loss.  She has no chest pain or shortness of breath.  She denies any nausea, vomiting, constipation, or diarrhea.  She has no urinary complaints.  Patient offers no specific complaints today.  REVIEW OF SYSTEMS:   Review of Systems  Constitutional: Negative.  Negative for fever, malaise/fatigue and weight loss.  Respiratory: Negative.  Negative for cough, hemoptysis and shortness of breath.   Cardiovascular: Negative.  Negative for chest pain and leg swelling.  Gastrointestinal: Negative.  Negative for abdominal pain.  Genitourinary: Negative.  Negative for dysuria.  Musculoskeletal: Negative.  Negative for back pain.  Skin: Negative.  Negative for rash.  Neurological: Negative.  Negative for focal weakness, weakness and headaches.  Psychiatric/Behavioral: Negative.  The patient is not nervous/anxious.     As per HPI. Otherwise, a complete review of systems is negative.  PAST MEDICAL HISTORY: Past Medical History:  Diagnosis Date  . GERD (gastroesophageal reflux disease)   . Hyperlipidemia     PAST SURGICAL HISTORY: Past Surgical History:  Procedure Laterality Date  . APPENDECTOMY    . BRAIN SURGERY      FAMILY HISTORY: No family history on file.  ADVANCED DIRECTIVES (Y/N):  N  HEALTH MAINTENANCE: Social History   Tobacco Use  .  Smoking status: Current Some Day Smoker  . Smokeless tobacco: Never Used  Substance Use Topics  . Alcohol use: No    Frequency: Never  . Drug use: Never     Colonoscopy:  PAP:  Bone density:  Lipid panel:  No Known Allergies  Current Outpatient Medications  Medication Sig Dispense Refill  . aspirin EC 81 MG tablet Take 1 tablet by mouth daily.     Marland Kitchen. CRANBERRY EXTRACT PO Take 1 tablet by mouth daily.    . pantoprazole (PROTONIX) 40 MG tablet Take 1 tablet by mouth 2 (two) times daily as needed.    . pravastatin (PRAVACHOL) 80 MG tablet Take 1 tablet by mouth daily.    Marland Kitchen. zolpidem (AMBIEN) 10 MG tablet Take 1 tablet by mouth at bedtime.     No current facility-administered medications for this visit.     OBJECTIVE: Vitals:   07/12/18 1354  BP: 134/84  Pulse: 78  Resp: 18  Temp: (!) 97.4 F (36.3 C)     Body mass index is 28.59 kg/m.    ECOG FS:0 - Asymptomatic  General: Well-developed, well-nourished, no acute distress. Eyes: Pink conjunctiva, anicteric sclera. HEENT: Normocephalic, moist mucous membranes. Lungs: Clear to auscultation bilaterally. Heart: Regular rate and rhythm. No rubs, murmurs, or gallops. Abdomen: Soft, nontender, nondistended. No organomegaly noted, normoactive bowel sounds. Musculoskeletal: No edema, cyanosis, or clubbing. Neuro: Alert, answering all questions appropriately. Cranial nerves grossly intact. Skin: No rashes or petechiae noted. Psych: Normal affect.  LAB RESULTS:  Lab Results  Component Value Date   NA 137 08/15/2016   K 4.4 08/15/2016   CL 104 08/15/2016  CO2 23 08/15/2016   GLUCOSE 98 08/15/2016   BUN 18 08/15/2016   CREATININE 0.92 08/15/2016   CALCIUM 9.2 08/15/2016   PROT 7.4 08/15/2016   ALBUMIN 3.9 08/15/2016   AST 24 08/15/2016   ALT 15 08/15/2016   ALKPHOS 63 08/15/2016   BILITOT 0.6 08/15/2016   GFRNONAA >60 08/15/2016   GFRAA >60 08/15/2016    Lab Results  Component Value Date   WBC 8.9 07/12/2018    NEUTROABS 5.1 07/12/2018   HGB 16.7 (H) 07/12/2018   HCT 49.6 (H) 07/12/2018   MCV 92.4 07/12/2018   PLT 226 07/12/2018     STUDIES: Mr Brain Wo Contrast  Result Date: 06/27/2018 CLINICAL DATA:  Remote aneurysm repair. Head pressure. Symptoms feel the same as at the time of initial aneurysm presentation. Dizziness. EXAM: MRI HEAD WITHOUT CONTRAST TECHNIQUE: Multiplanar, multiecho pulse sequences of the brain and surrounding structures were obtained without intravenous contrast. COMPARISON:  None. FINDINGS: BRAIN: There is no acute infarct, acute hemorrhage or mass effect. The midline structures are normal. There are no old infarcts. Minimal white matter hyperintensity, nonspecific and commonly seen in asymptomatic patients of this age. The CSF spaces are normal for age, with no hydrocephalus. Susceptibility-sensitive sequences show no chronic microhemorrhage or superficial siderosis. VASCULAR: Major intracranial arterial and venous sinus flow voids are preserved. SKULL AND UPPER CERVICAL SPINE: The visualized skull base, calvarium, upper cervical spine and extracranial soft tissues are normal. SINUSES/ORBITS: No fluid levels or advanced mucosal thickening. No mastoid or middle ear effusion. The orbits are normal. IMPRESSION: Normal brain for age. Electronically Signed   By: Deatra Robinson M.D.   On: 06/27/2018 03:27    ASSESSMENT: Polycythemia, secondary  PLAN:    1. 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  Patient has an elevated carbon monoxide level of 7.3%.  Her iron stores, erythropoietin level, and JAK-2 mutation are all either negative or within normal limits.  Patient's hemoglobin is still elevated, but continues to trend down.  This is likely attributed to her decreased tobacco use.  Patient does not require phlebotomy or intervention at this time.  She expressed understanding that she continues to smoke, this may be necessary in the future.  No follow-up has been scheduled at this  time.  Please refer patient back if patient's hemoglobin begins to increase and approaches 18.0.   2.  Tobacco use: Patient reports decreased use.  She has expressed interest in decreasing use and plans to further discuss with her primary care physician.  She also may benefit from referral to CT lung screening program.  I spent a total of 20 minutes face-to-face with the patient of which greater than 50% of the visit was spent in counseling and coordination of care as detailed above.    Patient expressed understanding and was in agreement with this plan. She also understands that She can call clinic at any time with any questions, concerns, or complaints.   Cancer Staging No matching staging information was found for the patient.  Jeralyn Ruths, MD   07/16/2018 12:43 PM

## 2018-07-12 ENCOUNTER — Inpatient Hospital Stay (HOSPITAL_BASED_OUTPATIENT_CLINIC_OR_DEPARTMENT_OTHER): Payer: Medicare HMO | Admitting: Oncology

## 2018-07-12 ENCOUNTER — Other Ambulatory Visit: Payer: Self-pay

## 2018-07-12 ENCOUNTER — Inpatient Hospital Stay: Payer: Medicare HMO

## 2018-07-12 ENCOUNTER — Inpatient Hospital Stay: Payer: Medicare HMO | Attending: Oncology

## 2018-07-12 VITALS — BP 134/84 | HR 78 | Temp 97.4°F | Resp 18 | Wt 151.3 lb

## 2018-07-12 DIAGNOSIS — D751 Secondary polycythemia: Secondary | ICD-10-CM

## 2018-07-12 DIAGNOSIS — F17200 Nicotine dependence, unspecified, uncomplicated: Secondary | ICD-10-CM

## 2018-07-12 LAB — CBC WITH DIFFERENTIAL/PLATELET
BASOS ABS: 0.1 10*3/uL (ref 0–0.1)
Basophils Relative: 1 %
Eosinophils Absolute: 0.1 10*3/uL (ref 0–0.7)
Eosinophils Relative: 1 %
HEMATOCRIT: 49.6 % — AB (ref 35.0–47.0)
HEMOGLOBIN: 16.7 g/dL — AB (ref 12.0–16.0)
LYMPHS PCT: 32 %
Lymphs Abs: 2.8 10*3/uL (ref 1.0–3.6)
MCH: 31 pg (ref 26.0–34.0)
MCHC: 33.6 g/dL (ref 32.0–36.0)
MCV: 92.4 fL (ref 80.0–100.0)
MONO ABS: 0.8 10*3/uL (ref 0.2–0.9)
MONOS PCT: 9 %
NEUTROS ABS: 5.1 10*3/uL (ref 1.4–6.5)
NEUTROS PCT: 57 %
Platelets: 226 10*3/uL (ref 150–440)
RBC: 5.37 MIL/uL — ABNORMAL HIGH (ref 3.80–5.20)
RDW: 14.3 % (ref 11.5–14.5)
WBC: 8.9 10*3/uL (ref 3.6–11.0)

## 2018-07-12 NOTE — Progress Notes (Signed)
Here for follow up. Per pt was feeling " pressure in the top of my head " ( hx of aneurysm )-per pt had stat MRI -finding negative. Pt started taking asa 81 mg x 2 per day. Sees Dr Kenard GowerHunday in 2 w-will consult w him then about this .

## 2018-07-19 ENCOUNTER — Other Ambulatory Visit: Payer: Self-pay

## 2018-07-19 ENCOUNTER — Ambulatory Visit: Payer: Self-pay | Admitting: Oncology

## 2018-08-03 DIAGNOSIS — R399 Unspecified symptoms and signs involving the genitourinary system: Secondary | ICD-10-CM | POA: Diagnosis not present

## 2018-08-03 DIAGNOSIS — R350 Frequency of micturition: Secondary | ICD-10-CM | POA: Diagnosis not present

## 2018-08-03 DIAGNOSIS — D751 Secondary polycythemia: Secondary | ICD-10-CM | POA: Diagnosis not present

## 2018-08-03 DIAGNOSIS — R8290 Unspecified abnormal findings in urine: Secondary | ICD-10-CM | POA: Diagnosis not present

## 2018-08-04 DIAGNOSIS — K219 Gastro-esophageal reflux disease without esophagitis: Secondary | ICD-10-CM | POA: Diagnosis not present

## 2018-08-04 DIAGNOSIS — F17200 Nicotine dependence, unspecified, uncomplicated: Secondary | ICD-10-CM | POA: Diagnosis not present

## 2018-08-04 DIAGNOSIS — F5104 Psychophysiologic insomnia: Secondary | ICD-10-CM | POA: Diagnosis not present

## 2018-08-04 DIAGNOSIS — I671 Cerebral aneurysm, nonruptured: Secondary | ICD-10-CM | POA: Diagnosis not present

## 2018-08-04 DIAGNOSIS — D751 Secondary polycythemia: Secondary | ICD-10-CM | POA: Diagnosis not present

## 2018-08-04 DIAGNOSIS — E782 Mixed hyperlipidemia: Secondary | ICD-10-CM | POA: Diagnosis not present

## 2019-01-03 DIAGNOSIS — I671 Cerebral aneurysm, nonruptured: Secondary | ICD-10-CM | POA: Diagnosis not present

## 2019-01-03 DIAGNOSIS — F5104 Psychophysiologic insomnia: Secondary | ICD-10-CM | POA: Diagnosis not present

## 2019-01-03 DIAGNOSIS — F17200 Nicotine dependence, unspecified, uncomplicated: Secondary | ICD-10-CM | POA: Diagnosis not present

## 2019-01-03 DIAGNOSIS — Z780 Asymptomatic menopausal state: Secondary | ICD-10-CM | POA: Diagnosis not present

## 2019-01-03 DIAGNOSIS — E782 Mixed hyperlipidemia: Secondary | ICD-10-CM | POA: Diagnosis not present

## 2019-01-03 DIAGNOSIS — Z0000 Encounter for general adult medical examination without abnormal findings: Secondary | ICD-10-CM | POA: Diagnosis not present

## 2019-01-03 DIAGNOSIS — K219 Gastro-esophageal reflux disease without esophagitis: Secondary | ICD-10-CM | POA: Diagnosis not present

## 2019-04-15 DIAGNOSIS — M810 Age-related osteoporosis without current pathological fracture: Secondary | ICD-10-CM | POA: Diagnosis not present

## 2019-05-30 DIAGNOSIS — R457 State of emotional shock and stress, unspecified: Secondary | ICD-10-CM | POA: Diagnosis not present

## 2019-05-30 DIAGNOSIS — E782 Mixed hyperlipidemia: Secondary | ICD-10-CM | POA: Diagnosis not present

## 2019-05-30 DIAGNOSIS — I671 Cerebral aneurysm, nonruptured: Secondary | ICD-10-CM | POA: Diagnosis not present

## 2019-05-30 DIAGNOSIS — F5104 Psychophysiologic insomnia: Secondary | ICD-10-CM | POA: Diagnosis not present

## 2019-05-30 DIAGNOSIS — F3340 Major depressive disorder, recurrent, in remission, unspecified: Secondary | ICD-10-CM | POA: Diagnosis not present

## 2019-05-30 DIAGNOSIS — K219 Gastro-esophageal reflux disease without esophagitis: Secondary | ICD-10-CM | POA: Diagnosis not present

## 2019-05-30 DIAGNOSIS — F17200 Nicotine dependence, unspecified, uncomplicated: Secondary | ICD-10-CM | POA: Diagnosis not present

## 2019-09-29 DIAGNOSIS — E782 Mixed hyperlipidemia: Secondary | ICD-10-CM | POA: Diagnosis not present

## 2019-09-29 DIAGNOSIS — R457 State of emotional shock and stress, unspecified: Secondary | ICD-10-CM | POA: Diagnosis not present

## 2019-09-29 DIAGNOSIS — F17200 Nicotine dependence, unspecified, uncomplicated: Secondary | ICD-10-CM | POA: Diagnosis not present

## 2019-09-29 DIAGNOSIS — F5104 Psychophysiologic insomnia: Secondary | ICD-10-CM | POA: Diagnosis not present

## 2019-09-29 DIAGNOSIS — I671 Cerebral aneurysm, nonruptured: Secondary | ICD-10-CM | POA: Diagnosis not present

## 2019-09-29 DIAGNOSIS — F3340 Major depressive disorder, recurrent, in remission, unspecified: Secondary | ICD-10-CM | POA: Diagnosis not present

## 2019-09-29 DIAGNOSIS — K219 Gastro-esophageal reflux disease without esophagitis: Secondary | ICD-10-CM | POA: Diagnosis not present

## 2019-10-03 DIAGNOSIS — Z79899 Other long term (current) drug therapy: Secondary | ICD-10-CM | POA: Diagnosis not present

## 2019-10-03 DIAGNOSIS — I10 Essential (primary) hypertension: Secondary | ICD-10-CM | POA: Diagnosis not present

## 2019-10-03 DIAGNOSIS — Z85841 Personal history of malignant neoplasm of brain: Secondary | ICD-10-CM | POA: Diagnosis not present

## 2019-10-03 DIAGNOSIS — M810 Age-related osteoporosis without current pathological fracture: Secondary | ICD-10-CM | POA: Diagnosis not present

## 2019-10-03 DIAGNOSIS — K219 Gastro-esophageal reflux disease without esophagitis: Secondary | ICD-10-CM | POA: Diagnosis not present

## 2019-10-03 DIAGNOSIS — E785 Hyperlipidemia, unspecified: Secondary | ICD-10-CM | POA: Diagnosis not present

## 2019-10-03 DIAGNOSIS — Z0000 Encounter for general adult medical examination without abnormal findings: Secondary | ICD-10-CM | POA: Diagnosis not present

## 2019-10-03 DIAGNOSIS — R42 Dizziness and giddiness: Secondary | ICD-10-CM | POA: Diagnosis not present

## 2019-10-03 DIAGNOSIS — D751 Secondary polycythemia: Secondary | ICD-10-CM | POA: Diagnosis not present

## 2020-01-24 DIAGNOSIS — F17200 Nicotine dependence, unspecified, uncomplicated: Secondary | ICD-10-CM | POA: Diagnosis not present

## 2020-01-24 DIAGNOSIS — K219 Gastro-esophageal reflux disease without esophagitis: Secondary | ICD-10-CM | POA: Diagnosis not present

## 2020-01-24 DIAGNOSIS — I10 Essential (primary) hypertension: Secondary | ICD-10-CM | POA: Diagnosis not present

## 2020-01-24 DIAGNOSIS — D751 Secondary polycythemia: Secondary | ICD-10-CM | POA: Diagnosis not present

## 2020-01-24 DIAGNOSIS — M810 Age-related osteoporosis without current pathological fracture: Secondary | ICD-10-CM | POA: Diagnosis not present

## 2020-01-24 DIAGNOSIS — E785 Hyperlipidemia, unspecified: Secondary | ICD-10-CM | POA: Diagnosis not present

## 2020-01-24 DIAGNOSIS — R42 Dizziness and giddiness: Secondary | ICD-10-CM | POA: Diagnosis not present

## 2020-01-24 DIAGNOSIS — M25561 Pain in right knee: Secondary | ICD-10-CM | POA: Diagnosis not present

## 2020-01-24 DIAGNOSIS — F5104 Psychophysiologic insomnia: Secondary | ICD-10-CM | POA: Diagnosis not present

## 2020-01-25 DIAGNOSIS — M25561 Pain in right knee: Secondary | ICD-10-CM | POA: Diagnosis not present

## 2020-01-27 DIAGNOSIS — I671 Cerebral aneurysm, nonruptured: Secondary | ICD-10-CM | POA: Diagnosis not present

## 2020-01-27 DIAGNOSIS — E782 Mixed hyperlipidemia: Secondary | ICD-10-CM | POA: Diagnosis not present

## 2020-01-27 DIAGNOSIS — Z0000 Encounter for general adult medical examination without abnormal findings: Secondary | ICD-10-CM | POA: Diagnosis not present

## 2020-01-27 DIAGNOSIS — H8110 Benign paroxysmal vertigo, unspecified ear: Secondary | ICD-10-CM | POA: Diagnosis not present

## 2020-01-27 DIAGNOSIS — Z79899 Other long term (current) drug therapy: Secondary | ICD-10-CM | POA: Diagnosis not present

## 2020-01-27 DIAGNOSIS — K219 Gastro-esophageal reflux disease without esophagitis: Secondary | ICD-10-CM | POA: Diagnosis not present

## 2020-01-27 DIAGNOSIS — D751 Secondary polycythemia: Secondary | ICD-10-CM | POA: Diagnosis not present

## 2020-01-27 DIAGNOSIS — R8290 Unspecified abnormal findings in urine: Secondary | ICD-10-CM | POA: Diagnosis not present

## 2020-01-27 DIAGNOSIS — F17200 Nicotine dependence, unspecified, uncomplicated: Secondary | ICD-10-CM | POA: Diagnosis not present

## 2020-01-27 DIAGNOSIS — F5104 Psychophysiologic insomnia: Secondary | ICD-10-CM | POA: Diagnosis not present

## 2020-02-01 DIAGNOSIS — F17210 Nicotine dependence, cigarettes, uncomplicated: Secondary | ICD-10-CM | POA: Diagnosis not present

## 2020-02-01 DIAGNOSIS — F5104 Psychophysiologic insomnia: Secondary | ICD-10-CM | POA: Diagnosis not present

## 2020-02-01 DIAGNOSIS — M25561 Pain in right knee: Secondary | ICD-10-CM | POA: Diagnosis not present

## 2020-02-01 DIAGNOSIS — Z79899 Other long term (current) drug therapy: Secondary | ICD-10-CM | POA: Diagnosis not present

## 2020-02-01 DIAGNOSIS — E785 Hyperlipidemia, unspecified: Secondary | ICD-10-CM | POA: Diagnosis not present

## 2020-02-01 DIAGNOSIS — D751 Secondary polycythemia: Secondary | ICD-10-CM | POA: Diagnosis not present

## 2020-02-01 DIAGNOSIS — I10 Essential (primary) hypertension: Secondary | ICD-10-CM | POA: Diagnosis not present

## 2020-02-01 DIAGNOSIS — Z0000 Encounter for general adult medical examination without abnormal findings: Secondary | ICD-10-CM | POA: Diagnosis not present

## 2020-02-01 DIAGNOSIS — H8110 Benign paroxysmal vertigo, unspecified ear: Secondary | ICD-10-CM | POA: Diagnosis not present

## 2020-02-01 DIAGNOSIS — K219 Gastro-esophageal reflux disease without esophagitis: Secondary | ICD-10-CM | POA: Diagnosis not present

## 2020-02-12 IMAGING — MR MR HEAD W/O CM
12 of 13 series · 40 of 48 positions shown · non-contrast
Comparison: None.

CLINICAL DATA: Remote aneurysm repair. Head pressure. Symptoms feel
the same as at the time of initial aneurysm presentation. Dizziness.

EXAM:
MRI HEAD WITHOUT CONTRAST
TECHNIQUE: Multiplanar, multiecho pulse sequences of the brain and surrounding
structures were obtained without intravenous contrast.

[Series 5: ax dwi_tracew · axial · 3.0mm · 0.73mm/px · z∈[-89,+72]mm · 3 of 55 slices shown (1 of 2)]
[im 1/55]
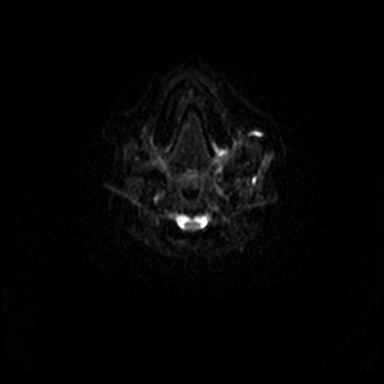
[im 28/55]
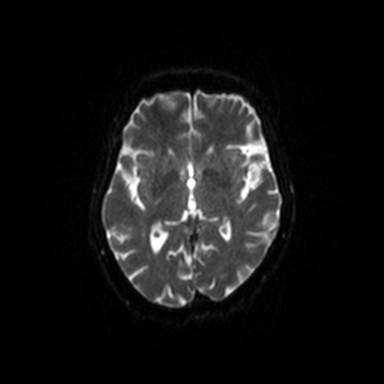
[im 55/55]
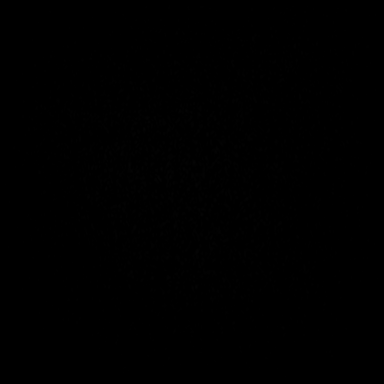

[Series 5: ax dwi_tracew · axial · 3.0mm · 0.73mm/px · z∈[-89,+72]mm · 3 of 54 slices shown (2 of 2)]
[im 1/54]
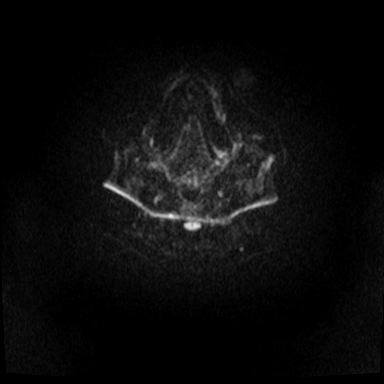
[im 27/54]
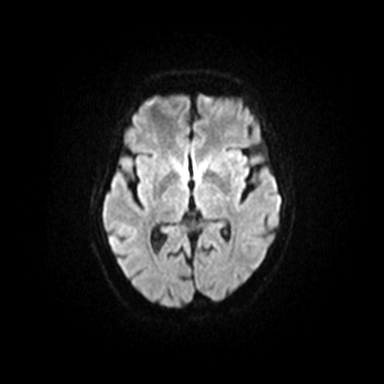
[im 54/54]
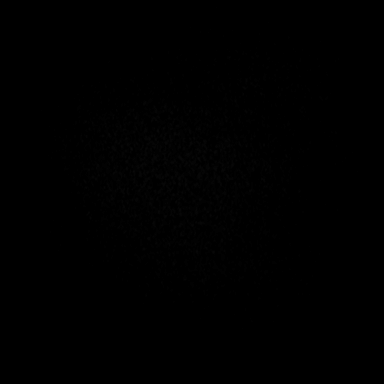

[Series 6: ax dwi_adc · axial · 3.0mm · 0.73mm/px · z∈[-89,+66]mm · 3 of 53 slices shown]
[im 1/53]
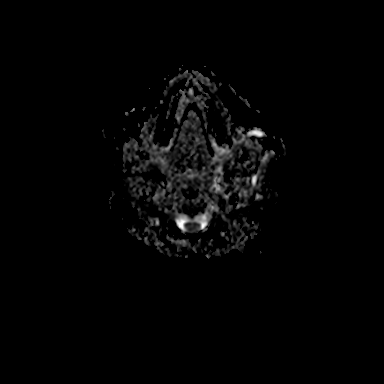
[im 27/53]
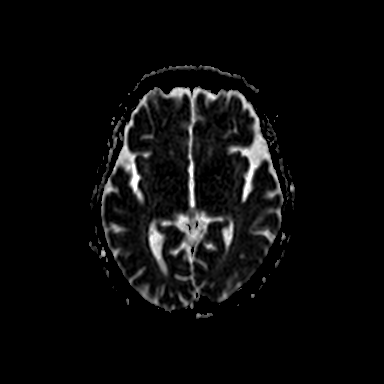
[im 53/53]
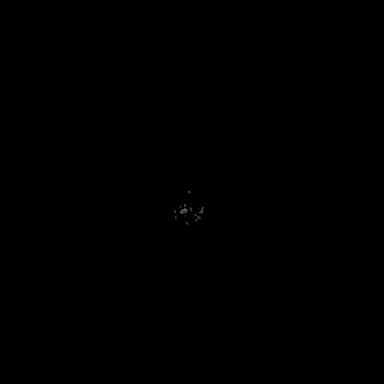

[Series 7: cor dwi_tracew · coronal · 5.0mm · 0.60mm/px · 3 of 40 slices shown (1 of 2)]
[im 1/40]
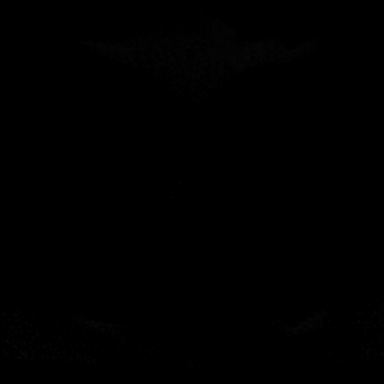
[im 20/40]
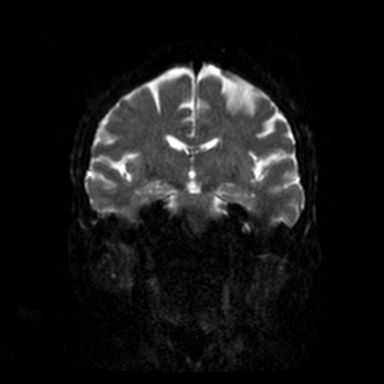
[im 40/40]
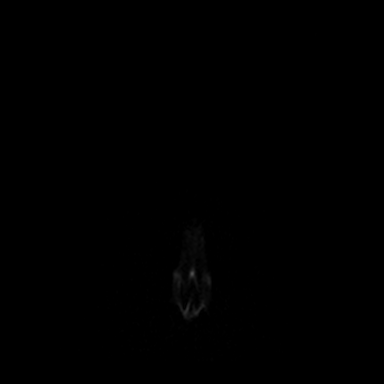

[Series 7: cor dwi_tracew · coronal · 5.0mm · 0.60mm/px · 3 of 40 slices shown (2 of 2)]
[im 1/40]
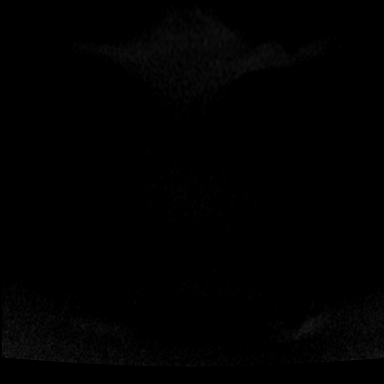
[im 20/40]
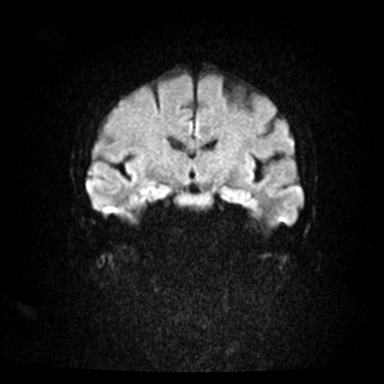
[im 40/40]
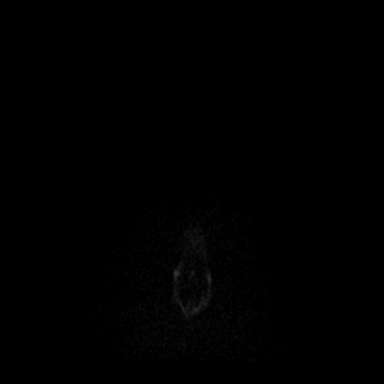

[Series 8: cor dwi_adc · coronal · 5.0mm · 0.60mm/px · 3 of 39 slices shown]
[im 1/39]
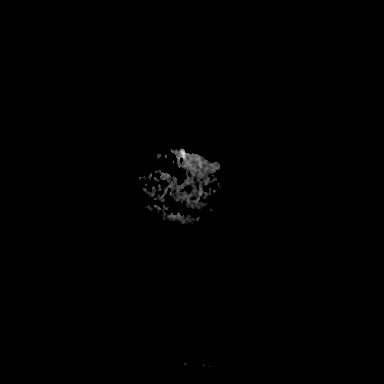
[im 20/39]
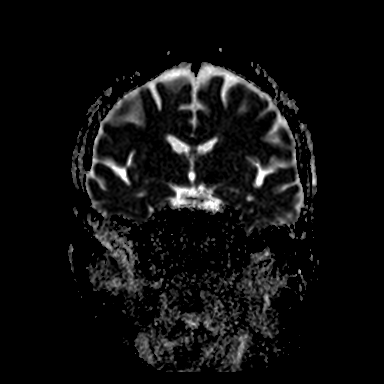
[im 39/39]
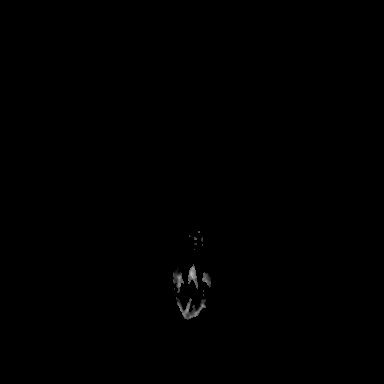

[Series 9: T1 · sagittal · 5.0mm · 0.62mm/px · 2 of 23 slices shown (1 of 2)]
[im 1/23]
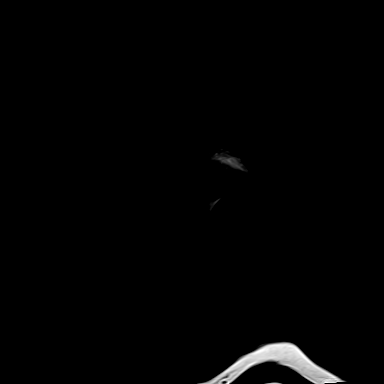
[im 23/23]
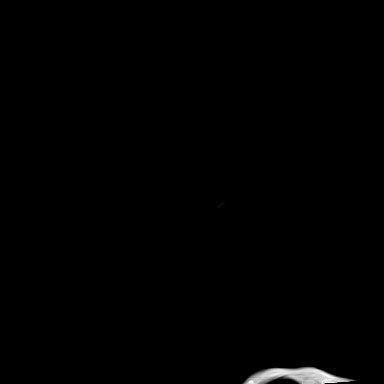

[Series 10: T2 · axial · 5.0mm · 0.53mm/px · z∈[-80,+64]mm · 2 of 25 slices shown (1 of 2)]
[im 1/25]
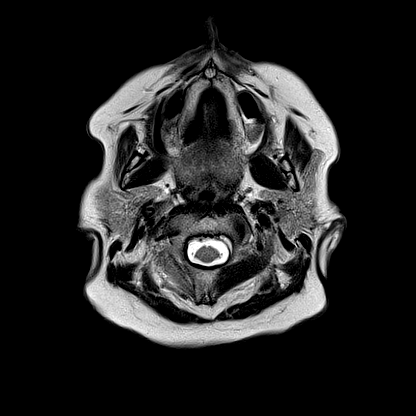
[im 25/25]
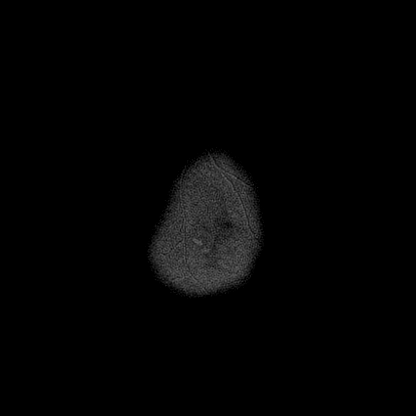

[Series 11: swi_images · axial · 3.0mm · 0.90mm/px · z∈[-96,+81]mm · 4 of 60 slices shown]
[im 1/60]
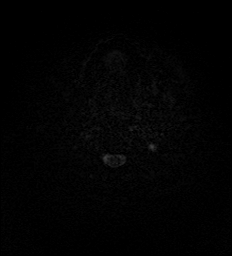
[im 20/60]
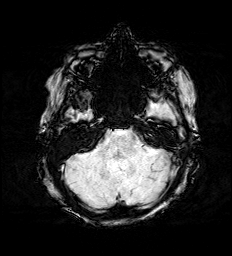
[im 40/60]
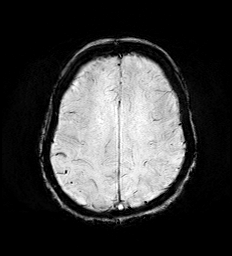
[im 60/60]
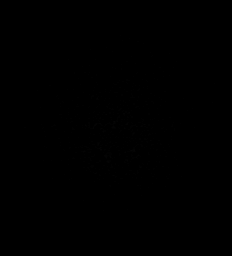

[Series 13: FLAIR · axial · 3.0mm · 0.53mm/px · z∈[-89,+73]mm · 4 of 55 slices shown]
[im 1/55]
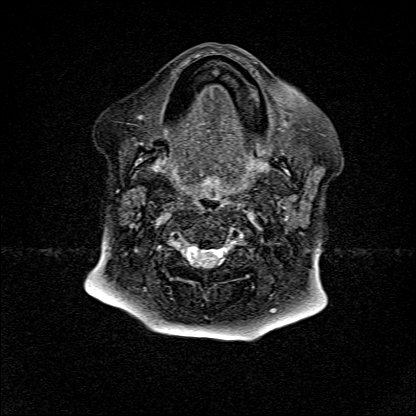
[im 19/55]
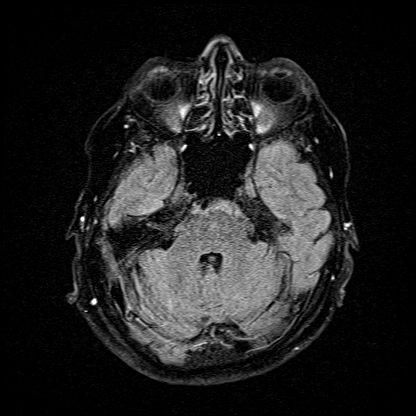
[im 37/55]
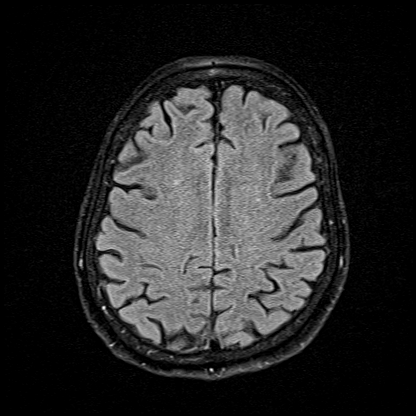
[im 55/55]
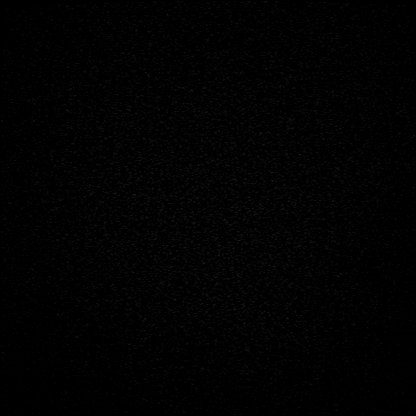

[Series 14: T1 · axial · 1.0mm · 0.98mm/px · z∈[-95,+79]mm · 8 of 176 slices shown (2 of 2)]
[im 1/176]
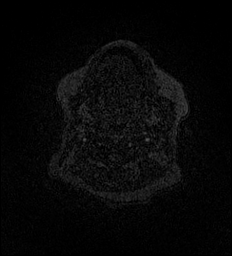
[im 32/176]
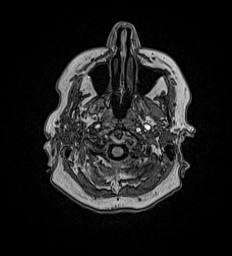
[im 48/176]
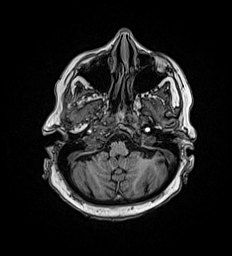
[im 80/176]
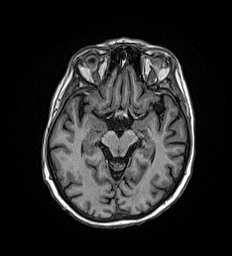
[im 96/176]
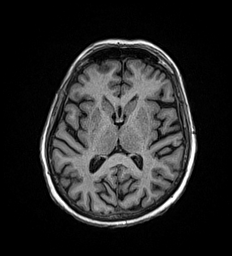
[im 128/176]
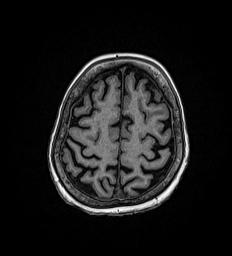
[im 144/176]
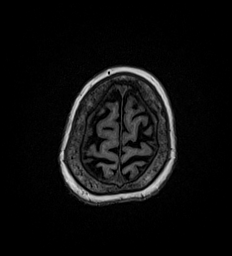
[im 176/176]
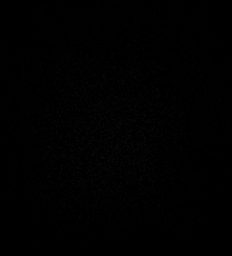

[Series 15: T2 · coronal · 5.0mm · 0.57mm/px · 2 of 29 slices shown (2 of 2)]
[im 1/29]
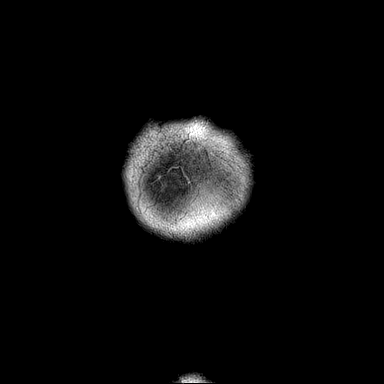
[im 29/29]
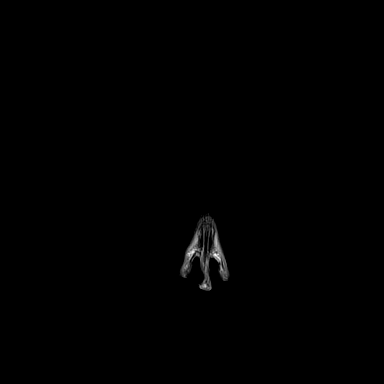

[40 of 48 positions shown; findings below may reference images not displayed]

FINDINGS: BRAIN: There is no acute infarct, acute hemorrhage or mass effect.
The midline structures are normal. There are no old infarcts.
Minimal white matter hyperintensity, nonspecific and commonly seen
in asymptomatic patients of this age. The CSF spaces are normal for
age, with no hydrocephalus. Susceptibility-sensitive sequences show
no chronic microhemorrhage or superficial siderosis.

VASCULAR: Major intracranial arterial and venous sinus flow voids
are preserved.

SKULL AND UPPER CERVICAL SPINE: The visualized skull base,
calvarium, upper cervical spine and extracranial soft tissues are
normal.

SINUSES/ORBITS: No fluid levels or advanced mucosal thickening. No
mastoid or middle ear effusion. The orbits are normal.
IMPRESSION: Normal brain for age.

## 2020-05-29 DIAGNOSIS — F5104 Psychophysiologic insomnia: Secondary | ICD-10-CM | POA: Diagnosis not present

## 2020-05-29 DIAGNOSIS — K219 Gastro-esophageal reflux disease without esophagitis: Secondary | ICD-10-CM | POA: Diagnosis not present

## 2020-05-29 DIAGNOSIS — E782 Mixed hyperlipidemia: Secondary | ICD-10-CM | POA: Diagnosis not present

## 2020-05-29 DIAGNOSIS — I671 Cerebral aneurysm, nonruptured: Secondary | ICD-10-CM | POA: Diagnosis not present

## 2020-05-29 DIAGNOSIS — F17200 Nicotine dependence, unspecified, uncomplicated: Secondary | ICD-10-CM | POA: Diagnosis not present

## 2020-06-05 DIAGNOSIS — F17200 Nicotine dependence, unspecified, uncomplicated: Secondary | ICD-10-CM | POA: Diagnosis not present

## 2020-06-05 DIAGNOSIS — F5104 Psychophysiologic insomnia: Secondary | ICD-10-CM | POA: Diagnosis not present

## 2020-06-05 DIAGNOSIS — E785 Hyperlipidemia, unspecified: Secondary | ICD-10-CM | POA: Diagnosis not present

## 2020-06-05 DIAGNOSIS — Z79899 Other long term (current) drug therapy: Secondary | ICD-10-CM | POA: Diagnosis not present

## 2020-06-05 DIAGNOSIS — D751 Secondary polycythemia: Secondary | ICD-10-CM | POA: Diagnosis not present

## 2020-06-05 DIAGNOSIS — K219 Gastro-esophageal reflux disease without esophagitis: Secondary | ICD-10-CM | POA: Diagnosis not present

## 2020-11-11 DIAGNOSIS — Z20822 Contact with and (suspected) exposure to covid-19: Secondary | ICD-10-CM | POA: Diagnosis not present

## 2021-01-30 DIAGNOSIS — E785 Hyperlipidemia, unspecified: Secondary | ICD-10-CM | POA: Diagnosis not present

## 2021-01-30 DIAGNOSIS — Z79899 Other long term (current) drug therapy: Secondary | ICD-10-CM | POA: Diagnosis not present

## 2021-01-30 DIAGNOSIS — Z0000 Encounter for general adult medical examination without abnormal findings: Secondary | ICD-10-CM | POA: Diagnosis not present

## 2021-01-30 DIAGNOSIS — F5104 Psychophysiologic insomnia: Secondary | ICD-10-CM | POA: Diagnosis not present

## 2021-01-30 DIAGNOSIS — D751 Secondary polycythemia: Secondary | ICD-10-CM | POA: Diagnosis not present

## 2021-01-30 DIAGNOSIS — F17210 Nicotine dependence, cigarettes, uncomplicated: Secondary | ICD-10-CM | POA: Diagnosis not present

## 2021-01-30 DIAGNOSIS — K219 Gastro-esophageal reflux disease without esophagitis: Secondary | ICD-10-CM | POA: Diagnosis not present

## 2021-04-23 DIAGNOSIS — R8290 Unspecified abnormal findings in urine: Secondary | ICD-10-CM | POA: Diagnosis not present

## 2021-05-29 DIAGNOSIS — D751 Secondary polycythemia: Secondary | ICD-10-CM | POA: Diagnosis not present

## 2021-05-29 DIAGNOSIS — F5104 Psychophysiologic insomnia: Secondary | ICD-10-CM | POA: Diagnosis not present

## 2021-05-29 DIAGNOSIS — F17200 Nicotine dependence, unspecified, uncomplicated: Secondary | ICD-10-CM | POA: Diagnosis not present

## 2021-05-29 DIAGNOSIS — K219 Gastro-esophageal reflux disease without esophagitis: Secondary | ICD-10-CM | POA: Diagnosis not present

## 2021-05-29 DIAGNOSIS — Z0000 Encounter for general adult medical examination without abnormal findings: Secondary | ICD-10-CM | POA: Diagnosis not present

## 2021-05-29 DIAGNOSIS — E782 Mixed hyperlipidemia: Secondary | ICD-10-CM | POA: Diagnosis not present

## 2021-06-05 DIAGNOSIS — E875 Hyperkalemia: Secondary | ICD-10-CM | POA: Diagnosis not present

## 2021-06-05 DIAGNOSIS — R42 Dizziness and giddiness: Secondary | ICD-10-CM | POA: Diagnosis not present

## 2021-06-05 DIAGNOSIS — F5104 Psychophysiologic insomnia: Secondary | ICD-10-CM | POA: Diagnosis not present

## 2021-06-05 DIAGNOSIS — E785 Hyperlipidemia, unspecified: Secondary | ICD-10-CM | POA: Diagnosis not present

## 2021-06-05 DIAGNOSIS — F17210 Nicotine dependence, cigarettes, uncomplicated: Secondary | ICD-10-CM | POA: Diagnosis not present

## 2021-06-05 DIAGNOSIS — Z79899 Other long term (current) drug therapy: Secondary | ICD-10-CM | POA: Diagnosis not present

## 2021-06-05 DIAGNOSIS — D751 Secondary polycythemia: Secondary | ICD-10-CM | POA: Diagnosis not present

## 2021-06-05 DIAGNOSIS — K219 Gastro-esophageal reflux disease without esophagitis: Secondary | ICD-10-CM | POA: Diagnosis not present

## 2021-06-21 DIAGNOSIS — R8290 Unspecified abnormal findings in urine: Secondary | ICD-10-CM | POA: Diagnosis not present

## 2021-06-26 DIAGNOSIS — R531 Weakness: Secondary | ICD-10-CM | POA: Diagnosis not present

## 2021-06-26 DIAGNOSIS — N390 Urinary tract infection, site not specified: Secondary | ICD-10-CM | POA: Diagnosis not present

## 2021-06-26 DIAGNOSIS — R42 Dizziness and giddiness: Secondary | ICD-10-CM | POA: Diagnosis not present

## 2021-08-16 ENCOUNTER — Other Ambulatory Visit: Payer: Self-pay | Admitting: *Deleted

## 2021-08-16 DIAGNOSIS — F17210 Nicotine dependence, cigarettes, uncomplicated: Secondary | ICD-10-CM

## 2021-08-16 DIAGNOSIS — Z87891 Personal history of nicotine dependence: Secondary | ICD-10-CM

## 2021-09-09 ENCOUNTER — Other Ambulatory Visit: Payer: Self-pay

## 2021-09-09 ENCOUNTER — Encounter: Payer: Self-pay | Admitting: Acute Care

## 2021-09-09 ENCOUNTER — Ambulatory Visit: Payer: Medicare HMO | Admitting: Acute Care

## 2021-09-09 DIAGNOSIS — F17210 Nicotine dependence, cigarettes, uncomplicated: Secondary | ICD-10-CM

## 2021-09-09 NOTE — Patient Instructions (Signed)
Thank you for participating in the [facility]. °It was our pleasure to meet you today. °We will call you with the results of your scan within the next few days. °Your scan will be assigned a Lung RADS category score by the physicians reading the scans.  °This Lung RADS score determines follow up scanning.  °See below for description of categories, and follow up screening recommendations. °We will be in touch to schedule your follow up screening annually or based on recommendations of our providers. °We will fax a copy of your scan results to your Primary Care Physician, or the physician who referred you to the program, to ensure they have the results. °Please call the office if you have any questions or concerns regarding your scanning experience or results.  °Our office number is [phone]. °Please speak with Denise Phelps, RN. She is our Lung Cancer Screening RN. °If she is unavailable when you call, please have the office staff send her a message. She will return your call at her earliest convenience. °Remember, if your scan is normal, we will scan you annually as long as you continue to meet the criteria for the program. (Age 55-77, Current smoker or smoker who has quit within the last 15 years). °If you are a smoker, remember, quitting is the single most powerful action that you can take to decrease your risk of lung cancer and other pulmonary, breathing related problems. °We know quitting is hard, and we are here to help.  °Please let us know if there is anything we can do to help you meet your goal of quitting. °If you are a former smoker, congratulations. We are proud of you! Remain smoke free! °Remember you can refer friends or family members through the number above.  °We will screen them to make sure they meet criteria for the program. °Thank you for helping us take better care of you by participating in Lung Screening. ° °You can receive free nicotine replacement therapy  ( patches, gum or mints) by calling 1-800-QUIT NOW. Please call so we can get you on the path to becoming  a non-smoker. I know it is hard, but you can do this! ° °Lung RADS Categories: ° °Lung RADS 1: no nodules or definitely non-concerning nodules.  °Recommendation is for a repeat annual scan in 12 months. ° °Lung RADS 2:  nodules that are non-concerning in appearance and behavior with a very low likelihood of becoming an active cancer. °Recommendation is for a repeat annual scan in 12 months. ° °Lung RADS 3: nodules that are probably non-concerning , includes nodules with a low likelihood of becoming an active cancer.  Recommendation is for a 6-month repeat screening scan. Often noted after an upper respiratory illness. We will be in touch to make sure you have no questions, and to schedule your 6-month scan. ° °Lung RADS 4 A: nodules with concerning findings, recommendation is most often for a follow up scan in 3 months or additional testing based on our provider's assessment of the scan. We will be in touch to make sure you have no questions and to schedule the recommended 3 month follow up scan. ° °Lung RADS 4 B:  indicates findings that are concerning. We will be in touch with you to schedule additional diagnostic testing based on our provider's  assessment of the scan. ° °Hypnosis for smoking cessation  °[employer] °[phone] ° °Acupuncture for smoking cessation  °[facility] °[phone]  °

## 2021-09-09 NOTE — Progress Notes (Deleted)
Virtual Visit via Telephone Note  I connected with Kathryn Houston on 09/09/21 at  4:00 PM EST by telephone and verified that I am speaking with the correct person using two identifiers.  Location: Patient: At home Provider: [address] [place], [place], [address]    I discussed the limitations, risks, security and privacy concerns of performing an evaluation and management service by telephone and the availability of in person appointments. I also discussed with the patient that there may be a patient responsible charge related to this service. The patient expressed understanding and agreed to proceed.    Shared Decision Making Visit Lung Cancer Screening Program [phone])   Eligibility: Age 80 y.o. Pack Years Smoking History Calculation 44 pack year smoking history (# packs/per year x # years smoked) Recent History of coughing up blood  no Unexplained weight loss? no ( >Than 15 pounds within the last 6 months ) Prior History Lung / other cancer no (Diagnosis within the last 5 years already requiring surveillance chest CT Scans). Smoking Status Current Smoker Former Smokers: Years since quit: NA  Quit Date: NA  Visit Components: Discussion included one or more decision making aids. no Discussion included risk/benefits of screening. no Discussion included potential follow up diagnostic testing for abnormal scans. no Discussion included meaning and risk of over diagnosis. no Discussion included meaning and risk of False Positives. no Discussion included meaning of total radiation exposure. no  Counseling Included: Importance of adherence to annual lung cancer LDCT screening. no Impact of comorbidities on ability to participate in the program. no Ability and willingness to under diagnostic treatment. no  Smoking Cessation Counseling: Current Smokers:  Discussed importance of smoking cessation. no Information about tobacco cessation classes and interventions  provided to patient. no Patient provided with "ticket" for LDCT Scan. no Symptomatic Patient. no  Counseling NA Diagnosis Code: Tobacco Use Z72.0 Asymptomatic Patient yes  Counseling (Intermediate counseling: > three minutes counseling) [id number] Former Smokers:  Discussed the importance of maintaining cigarette abstinence. yes Diagnosis Code: Personal History of Nicotine Dependence. Q59.563 Information about tobacco cessation classes and interventions provided to patient. Yes Patient provided with "ticket" for LDCT Scan. yes Written Order for Lung Cancer Screening with LDCT placed in Epic. Yes (CT Chest Lung Cancer Screening Low Dose W/O CM) [id number] Z12.2-Screening of respiratory organs Z87.891-Personal history of nicotine dependence  I have spent 25 minutes of face to face time with Kathryn Houston discussing the risks and benefits of lung cancer screening. We viewed a power point together that explained in detail the above noted topics. We paused at intervals to allow for questions to be asked and answered to ensure understanding.We discussed that the single most powerful action that she can take to decrease her risk of developing lung cancer is to quit smoking. We discussed whether or not she is ready to commit to setting a quit date. We discussed options for tools to aid in quitting smoking including nicotine replacement therapy, non-nicotine medications, support groups, Quit Smart classes, and behavior modification. We discussed that often times setting smaller, more achievable goals, such as eliminating 1 cigarette a day for a week and then 2 cigarettes a day for a week can be helpful in slowly decreasing the number of cigarettes smoked. This allows for a sense of accomplishment as well as providing a clinical benefit. I gave her the " Be Stronger Than Your Excuses" card with contact information for community resources, classes, free nicotine replacement therapy, and access to mobile apps,  text  messaging, and on-line smoking cessation help. I have also given her my card and contact information in the event she needs to contact me. We discussed the time and location of the scan, and that either Kathryn Miyamoto RN or I will call with the results within 24-48 hours of receiving them. I have offered her  a copy of the power point we viewed  as a resource in the event they need reinforcement of the concepts we discussed today in the office. The patient verbalized understanding of all of  the above and had no further questions upon leaving the office. They have my contact information in the event they have any further questions.  I spent 3 minutes counseling on smoking cessation and the health risks of continued tobacco abuse.  I explained to the patient that there has been a high incidence of coronary artery disease noted on these exams. I explained that this is a non-gated exam therefore degree or severity cannot be determined. This patient is currently on statin therapy. I have asked the patient to follow-up with their PCP regarding any incidental finding of coronary artery disease and management with diet or medication as their PCP  feels is clinically indicated. The patient verbalized understanding of the above and had no further questions upon completion of the visit.      Bevelyn Ngo, NP 09/09/2021

## 2021-09-10 ENCOUNTER — Ambulatory Visit: Payer: Medicare HMO

## 2021-10-01 ENCOUNTER — Other Ambulatory Visit: Payer: Self-pay

## 2021-10-01 ENCOUNTER — Emergency Department
Admission: EM | Admit: 2021-10-01 | Discharge: 2021-10-01 | Disposition: A | Discharge status: Home / Self Care | Payer: Medicare HMO | Attending: Emergency Medicine | Admitting: Emergency Medicine

## 2021-10-01 ENCOUNTER — Encounter: Payer: Self-pay | Admitting: Emergency Medicine

## 2021-10-01 ENCOUNTER — Emergency Department: Payer: Medicare HMO

## 2021-10-01 DIAGNOSIS — R42 Dizziness and giddiness: Secondary | ICD-10-CM | POA: Diagnosis not present

## 2021-10-01 DIAGNOSIS — F17200 Nicotine dependence, unspecified, uncomplicated: Secondary | ICD-10-CM | POA: Insufficient documentation

## 2021-10-01 DIAGNOSIS — Z79899 Other long term (current) drug therapy: Secondary | ICD-10-CM | POA: Insufficient documentation

## 2021-10-01 DIAGNOSIS — Z7982 Long term (current) use of aspirin: Secondary | ICD-10-CM | POA: Insufficient documentation

## 2021-10-01 LAB — CBC
HCT: 54.9 % — ABNORMAL HIGH (ref 36.0–46.0)
Hemoglobin: 16.3 g/dL — ABNORMAL HIGH (ref 12.0–15.0)
MCH: 29.2 pg (ref 26.0–34.0)
MCHC: 29.7 g/dL — ABNORMAL LOW (ref 30.0–36.0)
MCV: 98.4 fL (ref 80.0–100.0)
Platelets: 240 10*3/uL (ref 150–400)
RBC: 5.58 MIL/uL — ABNORMAL HIGH (ref 3.87–5.11)
RDW: 15.3 % (ref 11.5–15.5)
WBC: 9.8 10*3/uL (ref 4.0–10.5)
nRBC: 0 % (ref 0.0–0.2)

## 2021-10-01 LAB — BASIC METABOLIC PANEL
Anion gap: 4 — ABNORMAL LOW (ref 5–15)
BUN: 11 mg/dL (ref 8–23)
CO2: 25 mmol/L (ref 22–32)
Calcium: 8.8 mg/dL — ABNORMAL LOW (ref 8.9–10.3)
Chloride: 108 mmol/L (ref 98–111)
Creatinine, Ser: 0.88 mg/dL (ref 0.44–1.00)
GFR, Estimated: >60 mL/min (ref >60)
Glucose, Bld: 94 mg/dL (ref 70–99)
Potassium: 4.2 mmol/L (ref 3.5–5.1)
Sodium: 137 mmol/L (ref 135–145)

## 2021-10-01 MED ORDER — CEPHALEXIN 500 MG PO CAPS: 500.0000 mg | ORAL_CAPSULE | Freq: Two times a day (BID) | ORAL | 0 refills | Status: AC

## 2021-10-01 NOTE — ED Triage Notes (Addendum)
Pt reports that this am she woke up with dizziness, if she turns her head rapidly she becomes very dizzy. She reports  that she is have one episode of emesis. She thought that maybe she was dehydrated so she drank a bottle of water in 5 minutes and that is what she feels made her sick. Pt did take Meclozine,  she thought that it helped but dizziness came back. She had it because she had an UTI recently

## 2021-10-01 NOTE — ED Notes (Signed)
Pt verbalized understanding of discharge instructions, prescriptions, and follow-up care. Pt advised if symptoms worsen to return to ER.

## 2021-10-01 NOTE — ED Provider Notes (Signed)
,  [facility] Emergency Department Provider Note   ____________________________________________   Event Date/Time   First MD Initiated Contact with Patient 10/01/21 1752     (approximate)  I have reviewed the triage vital signs and the nursing notes.   HISTORY  Chief Complaint Dizziness    HPI Kathryn Houston is a 80 y.o. female with past medical history of hyperlipidemia, GERD, and polycythemia who presents to the ED complaining of dizziness.  Patient reports that when she woke up this morning and went to get out of bed she felt very dizzy like the room was spinning around her.  This was associated with nausea and one episode of vomiting.  She now states that whenever she moves her head too fast the sensation comes back.  She denies any associated vision changes, speech changes, numbness, or weakness.  She has not had any chest pain, palpitations, or shortness of breath.  She reports similar symptoms in the past and was prescribed meclizine for vertigo.  She took 1 dose of this this morning with improvement in her symptoms.  She states that she also feels like she has a urinary tract infection, denies dysuria but reports foul odor to urine.  She denies any fevers, abdominal pain, or flank pain.        Past Medical History:  Diagnosis Date   GERD (gastroesophageal reflux disease)    Hyperlipidemia     Patient Active Problem List   Diagnosis Date Noted   Polycythemia 06/13/2018    Past Surgical History:  Procedure Laterality Date   APPENDECTOMY     BRAIN SURGERY      Prior to Admission medications   Medication Sig Start Date End Date Taking? Authorizing Provider  cephALEXin (KEFLEX) 500 MG capsule Take 1 capsule (500 mg total) by mouth 2 (two) times daily for 7 days. 10/01/21 10/08/21 Yes Chesley Noon, MD  aspirin EC 81 MG tablet Take 1 tablet by mouth daily.     [provider]  CRANBERRY EXTRACT PO Take 1 tablet by mouth daily.     [provider]  pantoprazole (PROTONIX) 40 MG tablet Take 1 tablet by mouth 2 (two) times daily as needed. 08/12/16   [provider]  pravastatin (PRAVACHOL) 80 MG tablet Take 1 tablet by mouth daily. 08/12/16   [provider]  zolpidem (AMBIEN) 10 MG tablet Take 1 tablet by mouth at bedtime. 07/31/16   [provider]    Allergies Patient has no known allergies.  No family history on file.  Social History Social History   Tobacco Use   Smoking status: Some Days   Smokeless tobacco: Never  Substance Use Topics   Alcohol use: No   Drug use: Never    Review of Systems  Constitutional: No fever/chills Eyes: No visual changes. ENT: No sore throat. Cardiovascular: Denies chest pain. Respiratory: Denies shortness of breath. Gastrointestinal: No abdominal pain.  Positive for nausea and vomiting.  No diarrhea.  No constipation. Genitourinary: Negative for dysuria.  Positive for foul-smelling urine. Musculoskeletal: Negative for back pain. Skin: Negative for rash. Neurological: Negative for headaches, focal weakness or numbness.  Positive for dizziness.  ____________________________________________   PHYSICAL EXAM:  VITAL SIGNS: ED Triage Vitals [10/01/21 1624]  Enc Vitals Group     BP (!) 148/98     Pulse Rate 95     Resp 18     Temp 97.9 F (36.6 C)     Temp Source Oral  SpO2 95 %     Weight 160 lb (72.6 kg)     Height 5\' 1"  (1.549 m)     Head Circumference      Peak Flow      Pain Score 0     Pain Loc      Pain Edu?      Excl. in GC?     Constitutional: Alert and oriented. Eyes: Conjunctivae are normal. Head: Atraumatic. Nose: No congestion/rhinnorhea. Mouth/Throat: Mucous membranes are moist. Neck: Normal ROM Cardiovascular: Normal rate, regular rhythm. Grossly normal heart sounds.  2+ radial pulses bilaterally. Respiratory: Normal respiratory effort.  No retractions. Lungs CTAB. Gastrointestinal: Soft and  nontender. No distention. Genitourinary: deferred Musculoskeletal: No lower extremity tenderness nor edema. Neurologic:  Normal speech and language. No gross focal neurologic deficits are appreciated.  Ambulates with steady gait. Skin:  Skin is warm, dry and intact. No rash noted. Psychiatric: Mood and affect are normal. Speech and behavior are normal.  ____________________________________________   LABS (all labs ordered are listed, but only abnormal results are displayed)  Labs Reviewed  BASIC METABOLIC PANEL - Abnormal; Notable for the following components:      Result Value   Calcium 8.8 (*)    Anion gap 4 (*)    All other components within normal limits  CBC - Abnormal; Notable for the following components:   RBC 5.58 (*)    Hemoglobin 16.3 (*)    HCT 54.9 (*)    MCHC 29.7 (*)    All other components within normal limits  URINALYSIS, ROUTINE W REFLEX MICROSCOPIC  CBG MONITORING, ED   ____________________________________________  EKG  ED ECG REPORT I, , the attending physician, personally viewed and interpreted this ECG.   Date: 10/01/2021  EKG Time: 16:23  Rate: 94  Rhythm: normal sinus rhythm  Axis: Normal  Intervals:none  ST&T Change: None   PROCEDURES  Procedure(s) performed (including Critical Care):  Procedures   ____________________________________________   INITIAL IMPRESSION / ASSESSMENT AND PLAN / ED COURSE      80 year old female with past medical history of hyperlipidemia, GERD, and polycythemia presents to the ED complaining of dizziness and feeling like the room is spinning around her since she woke up this morning, associated with some nausea and vomiting.  Symptoms have now improved since she took a dose of meclizine at home, no neurologic findings to suggest stroke and she is now ambulating with a steady gait.  EKG shows no evidence of arrhythmia or ischemia and I doubt cardiac etiology of her symptoms.  Labs are  unremarkable, we will check UA given patient's recent urinary symptoms.  Patient attempted to provide urine sample but was unsuccessful, now demanding to be discharged home.  Symptoms consistent with peripheral vertigo and she has meclizine available, was counseled to continue this as needed.  She additionally states she has an appointment with her PCP tomorrow and will attempt to provide urine sample then.  She was prescribed Keflex to fill if urine sample is positive.  She was counseled to return to the ED for new worsening symptoms, patient agrees with plan.      ____________________________________________   FINAL CLINICAL IMPRESSION(S) / ED DIAGNOSES  Final diagnoses:  Dizziness  Vertigo     ED Discharge Orders          Ordered    cephALEXin (KEFLEX) 500 MG capsule  2 times daily        10/01/21 1823  Note:  This document was prepared using Dragon voice recognition software and may include unintentional dictation errors.    Chesley Noon, MD 10/01/21 [phone]

## 2021-10-29 DIAGNOSIS — D751 Secondary polycythemia: Secondary | ICD-10-CM | POA: Diagnosis not present

## 2021-10-29 DIAGNOSIS — R42 Dizziness and giddiness: Secondary | ICD-10-CM | POA: Diagnosis not present

## 2021-11-19 DIAGNOSIS — R42 Dizziness and giddiness: Secondary | ICD-10-CM | POA: Diagnosis not present

## 2021-12-10 DIAGNOSIS — D751 Secondary polycythemia: Secondary | ICD-10-CM | POA: Diagnosis not present

## 2021-12-10 DIAGNOSIS — Z8679 Personal history of other diseases of the circulatory system: Secondary | ICD-10-CM | POA: Diagnosis not present

## 2021-12-10 DIAGNOSIS — F5104 Psychophysiologic insomnia: Secondary | ICD-10-CM | POA: Diagnosis not present

## 2021-12-10 DIAGNOSIS — R42 Dizziness and giddiness: Secondary | ICD-10-CM | POA: Diagnosis not present

## 2021-12-10 DIAGNOSIS — E785 Hyperlipidemia, unspecified: Secondary | ICD-10-CM | POA: Diagnosis not present

## 2021-12-10 DIAGNOSIS — F17210 Nicotine dependence, cigarettes, uncomplicated: Secondary | ICD-10-CM | POA: Diagnosis not present

## 2021-12-10 DIAGNOSIS — K219 Gastro-esophageal reflux disease without esophagitis: Secondary | ICD-10-CM | POA: Diagnosis not present

## 2021-12-19 DIAGNOSIS — R42 Dizziness and giddiness: Secondary | ICD-10-CM | POA: Diagnosis not present

## 2021-12-24 DIAGNOSIS — H6123 Impacted cerumen, bilateral: Secondary | ICD-10-CM | POA: Diagnosis not present

## 2021-12-24 DIAGNOSIS — H903 Sensorineural hearing loss, bilateral: Secondary | ICD-10-CM | POA: Diagnosis not present

## 2022-01-02 DIAGNOSIS — K219 Gastro-esophageal reflux disease without esophagitis: Secondary | ICD-10-CM | POA: Diagnosis not present

## 2022-01-02 DIAGNOSIS — D751 Secondary polycythemia: Secondary | ICD-10-CM | POA: Diagnosis not present

## 2022-01-02 DIAGNOSIS — F17200 Nicotine dependence, unspecified, uncomplicated: Secondary | ICD-10-CM | POA: Diagnosis not present

## 2022-01-02 DIAGNOSIS — I671 Cerebral aneurysm, nonruptured: Secondary | ICD-10-CM | POA: Diagnosis not present

## 2022-01-02 DIAGNOSIS — G4733 Obstructive sleep apnea (adult) (pediatric): Secondary | ICD-10-CM | POA: Diagnosis not present

## 2022-01-02 DIAGNOSIS — E7800 Pure hypercholesterolemia, unspecified: Secondary | ICD-10-CM | POA: Diagnosis not present

## 2022-01-02 DIAGNOSIS — I471 Supraventricular tachycardia: Secondary | ICD-10-CM | POA: Diagnosis not present

## 2022-01-02 DIAGNOSIS — R0602 Shortness of breath: Secondary | ICD-10-CM | POA: Diagnosis not present

## 2022-01-02 DIAGNOSIS — E6609 Other obesity due to excess calories: Secondary | ICD-10-CM | POA: Diagnosis not present

## 2022-01-03 ENCOUNTER — Other Ambulatory Visit: Payer: Self-pay | Admitting: Internal Medicine

## 2022-01-03 DIAGNOSIS — I471 Supraventricular tachycardia: Secondary | ICD-10-CM

## 2022-01-06 ENCOUNTER — Encounter: Payer: Self-pay | Admitting: Emergency Medicine

## 2022-01-06 ENCOUNTER — Other Ambulatory Visit: Payer: Self-pay

## 2022-01-06 ENCOUNTER — Emergency Department: Payer: Medicare HMO

## 2022-01-06 ENCOUNTER — Emergency Department
Admission: EM | Admit: 2022-01-06 | Discharge: 2022-01-06 | Disposition: A | Discharge status: Home / Self Care | Payer: Medicare HMO | Attending: Emergency Medicine | Admitting: Emergency Medicine

## 2022-01-06 DIAGNOSIS — R0602 Shortness of breath: Secondary | ICD-10-CM | POA: Insufficient documentation

## 2022-01-06 DIAGNOSIS — R531 Weakness: Secondary | ICD-10-CM | POA: Diagnosis not present

## 2022-01-06 DIAGNOSIS — R7989 Other specified abnormal findings of blood chemistry: Secondary | ICD-10-CM | POA: Diagnosis not present

## 2022-01-06 DIAGNOSIS — K920 Hematemesis: Secondary | ICD-10-CM

## 2022-01-06 DIAGNOSIS — R718 Other abnormality of red blood cells: Secondary | ICD-10-CM | POA: Diagnosis not present

## 2022-01-06 DIAGNOSIS — R9431 Abnormal electrocardiogram [ECG] [EKG]: Secondary | ICD-10-CM | POA: Diagnosis not present

## 2022-01-06 DIAGNOSIS — I959 Hypotension, unspecified: Secondary | ICD-10-CM | POA: Diagnosis not present

## 2022-01-06 LAB — TROPONIN I (HIGH SENSITIVITY): Troponin I (High Sensitivity): 5 ng/L (ref <18)

## 2022-01-06 LAB — CBC
HCT: 50.1 % — ABNORMAL HIGH (ref 36.0–46.0)
Hemoglobin: 15.4 g/dL — ABNORMAL HIGH (ref 12.0–15.0)
MCH: 27.7 pg (ref 26.0–34.0)
MCHC: 30.7 g/dL (ref 30.0–36.0)
MCV: 90.1 fL (ref 80.0–100.0)
Platelets: 292 10*3/uL (ref 150–400)
RBC: 5.56 MIL/uL — ABNORMAL HIGH (ref 3.87–5.11)
RDW: 15 % (ref 11.5–15.5)
WBC: 10.5 10*3/uL (ref 4.0–10.5)
nRBC: 0 % (ref 0.0–0.2)

## 2022-01-06 LAB — COMPREHENSIVE METABOLIC PANEL
ALT: 12 U/L (ref 0–44)
AST: 19 U/L (ref 15–41)
Albumin: 3.4 g/dL — ABNORMAL LOW (ref 3.5–5.0)
Alkaline Phosphatase: 77 U/L (ref 38–126)
Anion gap: 12 (ref 5–15)
BUN: 14 mg/dL (ref 8–23)
CO2: 19 mmol/L — ABNORMAL LOW (ref 22–32)
Calcium: 8.5 mg/dL — ABNORMAL LOW (ref 8.9–10.3)
Chloride: 108 mmol/L (ref 98–111)
Creatinine, Ser: 1.15 mg/dL — ABNORMAL HIGH (ref 0.44–1.00)
GFR, Estimated: 49 mL/min — ABNORMAL LOW (ref >60)
Glucose, Bld: 89 mg/dL (ref 70–99)
Potassium: 4.1 mmol/L (ref 3.5–5.1)
Sodium: 139 mmol/L (ref 135–145)
Total Bilirubin: 0.8 mg/dL (ref 0.3–1.2)
Total Protein: 7.1 g/dL (ref 6.5–8.1)

## 2022-01-06 LAB — TYPE AND SCREEN
ABO/RH(D): A POS
Antibody Screen: NEGATIVE

## 2022-01-06 MED ORDER — PANTOPRAZOLE SODIUM 40 MG IV SOLR
40.0000 mg | Freq: Once | INTRAVENOUS | Status: AC
  Administered 2022-01-06: 40 mg via INTRAVENOUS
  Filled 2022-01-06: qty 10

## 2022-01-06 MED ORDER — SODIUM CHLORIDE 0.9 % IV BOLUS
1000.0000 mL | Freq: Once | INTRAVENOUS | Status: AC
  Administered 2022-01-06: 1000 mL via INTRAVENOUS

## 2022-01-06 NOTE — ED Notes (Signed)
Repeat type and screen sent to lab. Sample obtained via straight stick  ?

## 2022-01-06 NOTE — ED Triage Notes (Signed)
Pt to ED via POV with c/o dark coffee ground emesis last night, with a horrible smell to it. She went to her PMD for this and they found that she was orthostatic and told her to come to the ED for hypotension and possible GI bleed.  ?

## 2022-01-06 NOTE — ED Provider Notes (Signed)
? ?[facility] ?Provider Note ? ? ? Event Date/Time  ? First MD Initiated Contact with Patient 01/06/22 1238   ?  (approximate) ? ? ?History  ? ?Chief Complaint ?Hematemesis ? ? ?HPI ?Kathryn Houston is a 81 y.o. female, history of polycythemia, hyperlipidemia, GERD, appendectomy, presents to the emergency department for evaluation of hematemesis.  Patient states that last night she experienced an episode of vomiting in which she described the vomit as coffee-ground appearance and had a horrible smell associated with it..  She states that since then she has felt weak and short of breath.  She visited her regular doctor today, who told her that she had orthostatic hypotension and advised her to go to the emergency department.  She states that since then she has not had any episodes of vomiting.  Patient has had normal bowel movements.  No known history of ulcers.  Denies fever/chills, abdominal pain, back pain, urinary symptoms, headache, rashes, blood in stool, or chest pain. ? ?History Limitations: No limitations. ? ?  ? ? ?Physical Exam  ?Triage Vital Signs: ?ED Triage Vitals  ?Enc Vitals Group  ?   BP 01/06/22 1224 104/64  ?   Pulse Rate 01/06/22 1224 (!) 111  ?   Resp 01/06/22 1224 20  ?   Temp 01/06/22 1224 98.4 ?F (36.9 ?C)  ?   Temp Source 01/06/22 1224 Oral  ?   SpO2 01/06/22 1224 99 %  ?   Weight 01/06/22 1225 150 lb (68 kg)  ?   Height 01/06/22 1225 5\' 1"  (1.549 m)  ?   Head Circumference --   ?   Peak Flow --   ?   Pain Score 01/06/22 1224 0  ?   Pain Loc --   ?   Pain Edu? --   ?   Excl. in GC? --   ? ? ?Most recent vital signs: ?Vitals:  ? 01/06/22 1400 01/06/22 1430  ?BP: 112/61 111/78  ?Pulse: 92 90  ?Resp: (!) 21 (!) 26  ?Temp:    ?SpO2: 98% 97%  ? ? ?General: Awake, NAD.  ?Skin: Warm, dry.  ?CV: Good peripheral perfusion.  ?Resp: Normal effort.  Lung sounds clear bilaterally in the apices and bases. ?Abd: Soft, non-tender. No distention.  ?Neuro: At baseline. No gross  neurological deficits.  ?Other: No obvious rectal bleeding on exam.  Point-of-care occult blood test negative. ? ?Physical Exam ? ? ? ?ED Results / Procedures / Treatments  ?Labs ?(all labs ordered are listed, but only abnormal results are displayed) ?Labs Reviewed  ?COMPREHENSIVE METABOLIC PANEL - Abnormal; Notable for the following components:  ?    Result Value  ? CO2 19 (*)   ? Creatinine, Ser 1.15 (*)   ? Calcium 8.5 (*)   ? Albumin 3.4 (*)   ? GFR, Estimated 49 (*)   ? All other components within normal limits  ?CBC - Abnormal; Notable for the following components:  ? RBC 5.56 (*)   ? Hemoglobin 15.4 (*)   ? HCT 50.1 (*)   ? All other components within normal limits  ?POC OCCULT BLOOD, ED  ?TYPE AND SCREEN  ?TYPE AND SCREEN  ?TROPONIN I (HIGH SENSITIVITY)  ? ? ? ?EKG ?Sinus rhythm, rate of 99, no ST segment changes, left axis deviation present, no AV blocks, normal QRS interval. ? ? ?RADIOLOGY ? ?ED Provider Interpretation: I personally reviewed and interpreted this image, no acute findings. ? ?DG Chest 1 View ? ?  Result Date: 01/06/2022 ?CLINICAL DATA:  Shortness of breath.  Orthostatic hypotension. EXAM: CHEST  1 VIEW COMPARISON:  None. FINDINGS: Slightly coarse lung markings could represent chronic change versus minimal edema. Heart size is normal. Negative for a pneumothorax. No acute bone abnormality. IMPRESSION: Slightly prominent lung markings. Findings could represent chronic changes versus mild edema. Electronically Signed   By: Richarda Overlie M.D.   On: 01/06/2022 13:24   ? ?PROCEDURES: ? ?Critical Care performed: None. ? ?Procedures ? ? ? ?MEDICATIONS ORDERED IN ED: ?Medications  ?sodium chloride 0.9 % bolus 1,000 mL (0 mLs Intravenous Stopped 01/06/22 1650)  ?pantoprazole (PROTONIX) injection 40 mg (40 mg Intravenous Given 01/06/22 1358)  ? ? ? ?IMPRESSION / MDM / ASSESSMENT AND PLAN / ED COURSE  ?I reviewed the triage vital signs and the nursing notes. ?             ?               ? ? ?Differential  diagnosis includes, but is not limited to, peptic/gastric ulcer, esophageal perforation, Mallory-Weiss tear, ACS, ? ?ED Course ?Patient appears well.  Vital signs within normal limits.  NAD.  Given her history, we will go ahead initiate IV fluids and Protonix. ? ?CBC shows no evidence of leukocytosis.  Hemoglobin 15.4 from 16.3 three months prior. ? ?CMP shows elevated creatinine at 1.15.  Currently treating with IV fluids.  No evidence of electrolyte abnormalities. ? ?Point-of-care occult blood test negative. ? ?Assessment/Plan ?Patient presented for evaluation of 1 episode of coffee-grind like emesis.  She has not had any additional episodes since.  She did endorse some weakness earlier, however she states that she no longer feels weak after the IV fluids.  Her lab work-up is reassuring.  No evidence of blood on point-of-care occult blood test.  I suspect that the patient likely has peptic/gastric ulcers, especially given her longstanding history of GERD, though she does not appear to have any extensive or life-threatening bleeding.  She is currently already on pantoprazole p.o. treatment at home.  I believe she is stable for discharge with outpatient gastroenterology follow-up.  We will provide her a referral.  We will discharge this patient after she finishes her IV fluids.  Patient expressed understanding and agreed with the plan ? ?Considered admission for this patient, but given her stable vital signs, unremarkable lab work-up, and presentation, she is unlikely to benefit. ? ?Patient was provided with anticipatory guidance, return precautions, and educational material. Encouraged the patient to return to the emergency department at any time if they begin to experience any new or worsening symptoms.  ? ?  ? ? ?FINAL CLINICAL IMPRESSION(S) / ED DIAGNOSES  ? ?Final diagnoses:  ?Hematemesis without nausea  ? ? ? ?Rx / DC Orders  ? ?ED Discharge Orders   ? ? None  ? ?  ? ? ? ?Note:  This document was prepared  using Dragon voice recognition software and may include unintentional dictation errors. ?  ?Varney Daily, [place] ?01/06/22 2021 ? ?  ?Merwyn Katos, MD ?01/07/22 1517 ? ?

## 2022-01-06 NOTE — ED Notes (Signed)
When pt got back to triage she was still standing when I took her bp and it was 60/42. Had pt sit and it was 104/64. Pt reports that she has been hypotensive and has been trying to figure out why. She has been to cardiologist an ENT's no one has been able to find the answer.  ?

## 2022-01-06 NOTE — Discharge Instructions (Addendum)
-  Schedule an appointment with the gastroenterologist listed above 1 day. ?-Continue to take your pantoprazole as discussed. ?-Return to the emergency department anytime if you begin to experience any new or worsening symptoms. ?

## 2022-01-09 DIAGNOSIS — K219 Gastro-esophageal reflux disease without esophagitis: Secondary | ICD-10-CM | POA: Diagnosis not present

## 2022-01-09 DIAGNOSIS — Z800 Family history of malignant neoplasm of digestive organs: Secondary | ICD-10-CM | POA: Diagnosis not present

## 2022-01-09 DIAGNOSIS — R1110 Vomiting, unspecified: Secondary | ICD-10-CM | POA: Diagnosis not present

## 2022-01-20 DIAGNOSIS — I471 Supraventricular tachycardia: Secondary | ICD-10-CM | POA: Diagnosis not present

## 2022-01-20 DIAGNOSIS — R0602 Shortness of breath: Secondary | ICD-10-CM | POA: Diagnosis not present

## 2022-01-23 DIAGNOSIS — Z6832 Body mass index (BMI) 32.0-32.9, adult: Secondary | ICD-10-CM | POA: Diagnosis not present

## 2022-01-23 DIAGNOSIS — I471 Supraventricular tachycardia: Secondary | ICD-10-CM | POA: Diagnosis not present

## 2022-01-23 DIAGNOSIS — Z1389 Encounter for screening for other disorder: Secondary | ICD-10-CM | POA: Diagnosis not present

## 2022-01-23 DIAGNOSIS — F5104 Psychophysiologic insomnia: Secondary | ICD-10-CM | POA: Diagnosis not present

## 2022-01-23 DIAGNOSIS — F17210 Nicotine dependence, cigarettes, uncomplicated: Secondary | ICD-10-CM | POA: Diagnosis not present

## 2022-01-23 DIAGNOSIS — Z0000 Encounter for general adult medical examination without abnormal findings: Secondary | ICD-10-CM | POA: Diagnosis not present

## 2022-01-23 DIAGNOSIS — R1110 Vomiting, unspecified: Secondary | ICD-10-CM | POA: Diagnosis not present

## 2022-01-23 DIAGNOSIS — E785 Hyperlipidemia, unspecified: Secondary | ICD-10-CM | POA: Diagnosis not present

## 2022-04-07 DIAGNOSIS — F17200 Nicotine dependence, unspecified, uncomplicated: Secondary | ICD-10-CM | POA: Diagnosis not present

## 2022-04-07 DIAGNOSIS — F5104 Psychophysiologic insomnia: Secondary | ICD-10-CM | POA: Diagnosis not present

## 2022-04-07 DIAGNOSIS — E785 Hyperlipidemia, unspecified: Secondary | ICD-10-CM | POA: Diagnosis not present

## 2022-04-21 ENCOUNTER — Other Ambulatory Visit: Payer: Self-pay | Admitting: Internal Medicine

## 2022-04-21 DIAGNOSIS — R519 Headache, unspecified: Secondary | ICD-10-CM

## 2022-09-25 DIAGNOSIS — J019 Acute sinusitis, unspecified: Secondary | ICD-10-CM | POA: Diagnosis not present

## 2022-09-25 DIAGNOSIS — Z03818 Encounter for observation for suspected exposure to other biological agents ruled out: Secondary | ICD-10-CM | POA: Diagnosis not present

## 2022-09-25 DIAGNOSIS — J209 Acute bronchitis, unspecified: Secondary | ICD-10-CM | POA: Diagnosis not present

## 2022-09-25 DIAGNOSIS — B9689 Other specified bacterial agents as the cause of diseases classified elsewhere: Secondary | ICD-10-CM | POA: Diagnosis not present

## 2022-10-31 DIAGNOSIS — F330 Major depressive disorder, recurrent, mild: Secondary | ICD-10-CM | POA: Diagnosis not present

## 2022-10-31 DIAGNOSIS — N289 Disorder of kidney and ureter, unspecified: Secondary | ICD-10-CM | POA: Diagnosis not present

## 2022-10-31 DIAGNOSIS — Z87891 Personal history of nicotine dependence: Secondary | ICD-10-CM | POA: Diagnosis not present

## 2022-10-31 DIAGNOSIS — R8290 Unspecified abnormal findings in urine: Secondary | ICD-10-CM | POA: Diagnosis not present

## 2022-10-31 DIAGNOSIS — E7800 Pure hypercholesterolemia, unspecified: Secondary | ICD-10-CM | POA: Diagnosis not present

## 2022-10-31 DIAGNOSIS — R519 Headache, unspecified: Secondary | ICD-10-CM | POA: Diagnosis not present

## 2022-10-31 DIAGNOSIS — R7309 Other abnormal glucose: Secondary | ICD-10-CM | POA: Diagnosis not present

## 2022-10-31 DIAGNOSIS — F5103 Paradoxical insomnia: Secondary | ICD-10-CM | POA: Diagnosis not present

## 2022-10-31 DIAGNOSIS — N390 Urinary tract infection, site not specified: Secondary | ICD-10-CM | POA: Diagnosis not present

## 2022-10-31 DIAGNOSIS — R399 Unspecified symptoms and signs involving the genitourinary system: Secondary | ICD-10-CM | POA: Diagnosis not present

## 2022-10-31 DIAGNOSIS — I671 Cerebral aneurysm, nonruptured: Secondary | ICD-10-CM | POA: Diagnosis not present

## 2022-11-03 DIAGNOSIS — K219 Gastro-esophageal reflux disease without esophagitis: Secondary | ICD-10-CM | POA: Diagnosis not present

## 2022-11-03 DIAGNOSIS — Z0000 Encounter for general adult medical examination without abnormal findings: Secondary | ICD-10-CM | POA: Diagnosis not present

## 2022-11-03 DIAGNOSIS — R0609 Other forms of dyspnea: Secondary | ICD-10-CM | POA: Diagnosis not present

## 2022-11-03 DIAGNOSIS — Z2821 Immunization not carried out because of patient refusal: Secondary | ICD-10-CM | POA: Diagnosis not present

## 2022-11-03 DIAGNOSIS — E785 Hyperlipidemia, unspecified: Secondary | ICD-10-CM | POA: Diagnosis not present

## 2022-11-03 DIAGNOSIS — F17210 Nicotine dependence, cigarettes, uncomplicated: Secondary | ICD-10-CM | POA: Diagnosis not present

## 2022-11-03 DIAGNOSIS — N390 Urinary tract infection, site not specified: Secondary | ICD-10-CM | POA: Diagnosis not present

## 2022-11-03 DIAGNOSIS — R42 Dizziness and giddiness: Secondary | ICD-10-CM | POA: Diagnosis not present

## 2022-11-11 DIAGNOSIS — R002 Palpitations: Secondary | ICD-10-CM | POA: Diagnosis not present

## 2022-11-11 DIAGNOSIS — I471 Supraventricular tachycardia, unspecified: Secondary | ICD-10-CM | POA: Diagnosis not present

## 2022-11-11 DIAGNOSIS — R42 Dizziness and giddiness: Secondary | ICD-10-CM | POA: Diagnosis not present

## 2023-02-17 DIAGNOSIS — R42 Dizziness and giddiness: Secondary | ICD-10-CM | POA: Diagnosis not present

## 2023-02-17 DIAGNOSIS — D751 Secondary polycythemia: Secondary | ICD-10-CM | POA: Diagnosis not present

## 2023-02-17 DIAGNOSIS — F17210 Nicotine dependence, cigarettes, uncomplicated: Secondary | ICD-10-CM | POA: Diagnosis not present

## 2023-02-17 DIAGNOSIS — F5104 Psychophysiologic insomnia: Secondary | ICD-10-CM | POA: Diagnosis not present

## 2023-02-17 DIAGNOSIS — R300 Dysuria: Secondary | ICD-10-CM | POA: Diagnosis not present

## 2023-02-17 DIAGNOSIS — K219 Gastro-esophageal reflux disease without esophagitis: Secondary | ICD-10-CM | POA: Diagnosis not present

## 2023-02-17 DIAGNOSIS — R82998 Other abnormal findings in urine: Secondary | ICD-10-CM | POA: Diagnosis not present

## 2023-03-03 ENCOUNTER — Other Ambulatory Visit: Payer: Self-pay | Admitting: Internal Medicine

## 2023-03-03 DIAGNOSIS — R42 Dizziness and giddiness: Secondary | ICD-10-CM | POA: Diagnosis not present

## 2023-03-03 DIAGNOSIS — R519 Headache, unspecified: Secondary | ICD-10-CM

## 2023-03-03 DIAGNOSIS — I671 Cerebral aneurysm, nonruptured: Secondary | ICD-10-CM

## 2023-03-03 DIAGNOSIS — F17210 Nicotine dependence, cigarettes, uncomplicated: Secondary | ICD-10-CM | POA: Diagnosis not present

## 2023-03-03 DIAGNOSIS — F5104 Psychophysiologic insomnia: Secondary | ICD-10-CM | POA: Diagnosis not present

## 2023-03-03 DIAGNOSIS — R300 Dysuria: Secondary | ICD-10-CM | POA: Diagnosis not present

## 2023-03-08 ENCOUNTER — Ambulatory Visit
Admission: RE | Admit: 2023-03-08 | Discharge: 2023-03-08 | Disposition: A | Discharge status: Home / Self Care | Payer: Medicare HMO | Source: Ambulatory Visit | Attending: Internal Medicine | Admitting: Internal Medicine

## 2023-03-08 DIAGNOSIS — I671 Cerebral aneurysm, nonruptured: Secondary | ICD-10-CM | POA: Diagnosis not present

## 2023-03-08 DIAGNOSIS — R519 Headache, unspecified: Secondary | ICD-10-CM | POA: Diagnosis not present

## 2023-03-08 DIAGNOSIS — R42 Dizziness and giddiness: Secondary | ICD-10-CM | POA: Diagnosis not present

## 2023-03-08 DIAGNOSIS — G319 Degenerative disease of nervous system, unspecified: Secondary | ICD-10-CM | POA: Diagnosis not present

## 2023-03-20 DIAGNOSIS — I671 Cerebral aneurysm, nonruptured: Secondary | ICD-10-CM | POA: Diagnosis not present

## 2023-03-20 DIAGNOSIS — R7309 Other abnormal glucose: Secondary | ICD-10-CM | POA: Diagnosis not present

## 2023-03-20 DIAGNOSIS — R42 Dizziness and giddiness: Secondary | ICD-10-CM | POA: Diagnosis not present

## 2023-04-03 DIAGNOSIS — R7309 Other abnormal glucose: Secondary | ICD-10-CM | POA: Diagnosis not present

## 2023-04-03 DIAGNOSIS — K219 Gastro-esophageal reflux disease without esophagitis: Secondary | ICD-10-CM | POA: Diagnosis not present

## 2023-04-03 DIAGNOSIS — I671 Cerebral aneurysm, nonruptured: Secondary | ICD-10-CM | POA: Diagnosis not present

## 2023-04-03 DIAGNOSIS — D751 Secondary polycythemia: Secondary | ICD-10-CM | POA: Diagnosis not present

## 2023-04-03 DIAGNOSIS — R42 Dizziness and giddiness: Secondary | ICD-10-CM | POA: Diagnosis not present

## 2023-04-03 DIAGNOSIS — E7800 Pure hypercholesterolemia, unspecified: Secondary | ICD-10-CM | POA: Diagnosis not present

## 2023-06-16 DIAGNOSIS — M79672 Pain in left foot: Secondary | ICD-10-CM | POA: Diagnosis not present

## 2023-11-17 DIAGNOSIS — E7800 Pure hypercholesterolemia, unspecified: Secondary | ICD-10-CM | POA: Diagnosis not present

## 2023-11-17 DIAGNOSIS — Z6831 Body mass index (BMI) 31.0-31.9, adult: Secondary | ICD-10-CM | POA: Diagnosis not present

## 2023-11-17 DIAGNOSIS — D751 Secondary polycythemia: Secondary | ICD-10-CM | POA: Diagnosis not present

## 2023-11-17 DIAGNOSIS — Z1329 Encounter for screening for other suspected endocrine disorder: Secondary | ICD-10-CM | POA: Diagnosis not present

## 2023-11-17 DIAGNOSIS — R42 Dizziness and giddiness: Secondary | ICD-10-CM | POA: Diagnosis not present

## 2023-11-17 DIAGNOSIS — R300 Dysuria: Secondary | ICD-10-CM | POA: Diagnosis not present

## 2023-11-17 DIAGNOSIS — K219 Gastro-esophageal reflux disease without esophagitis: Secondary | ICD-10-CM | POA: Diagnosis not present

## 2023-11-17 DIAGNOSIS — F17200 Nicotine dependence, unspecified, uncomplicated: Secondary | ICD-10-CM | POA: Diagnosis not present

## 2023-11-17 DIAGNOSIS — R7309 Other abnormal glucose: Secondary | ICD-10-CM | POA: Diagnosis not present

## 2023-11-18 DIAGNOSIS — K219 Gastro-esophageal reflux disease without esophagitis: Secondary | ICD-10-CM | POA: Diagnosis not present

## 2023-11-18 DIAGNOSIS — Z6831 Body mass index (BMI) 31.0-31.9, adult: Secondary | ICD-10-CM | POA: Diagnosis not present

## 2023-11-18 DIAGNOSIS — E7800 Pure hypercholesterolemia, unspecified: Secondary | ICD-10-CM | POA: Diagnosis not present

## 2023-11-18 DIAGNOSIS — R300 Dysuria: Secondary | ICD-10-CM | POA: Diagnosis not present

## 2023-11-18 DIAGNOSIS — F17200 Nicotine dependence, unspecified, uncomplicated: Secondary | ICD-10-CM | POA: Diagnosis not present

## 2023-11-18 DIAGNOSIS — R42 Dizziness and giddiness: Secondary | ICD-10-CM | POA: Diagnosis not present

## 2023-11-18 DIAGNOSIS — Z1329 Encounter for screening for other suspected endocrine disorder: Secondary | ICD-10-CM | POA: Diagnosis not present

## 2023-11-18 DIAGNOSIS — R7309 Other abnormal glucose: Secondary | ICD-10-CM | POA: Diagnosis not present

## 2023-11-18 DIAGNOSIS — D751 Secondary polycythemia: Secondary | ICD-10-CM | POA: Diagnosis not present

## 2023-12-02 DIAGNOSIS — D751 Secondary polycythemia: Secondary | ICD-10-CM | POA: Diagnosis not present

## 2023-12-02 DIAGNOSIS — R7309 Other abnormal glucose: Secondary | ICD-10-CM | POA: Diagnosis not present

## 2023-12-02 DIAGNOSIS — E7800 Pure hypercholesterolemia, unspecified: Secondary | ICD-10-CM | POA: Diagnosis not present

## 2023-12-02 DIAGNOSIS — N1832 Chronic kidney disease, stage 3b: Secondary | ICD-10-CM | POA: Diagnosis not present

## 2024-05-12 DIAGNOSIS — R42 Dizziness and giddiness: Secondary | ICD-10-CM | POA: Diagnosis not present

## 2024-05-12 DIAGNOSIS — Z0000 Encounter for general adult medical examination without abnormal findings: Secondary | ICD-10-CM | POA: Diagnosis not present

## 2024-05-12 DIAGNOSIS — I10 Essential (primary) hypertension: Secondary | ICD-10-CM | POA: Diagnosis not present

## 2024-05-12 DIAGNOSIS — E785 Hyperlipidemia, unspecified: Secondary | ICD-10-CM | POA: Diagnosis not present

## 2024-05-12 DIAGNOSIS — K219 Gastro-esophageal reflux disease without esophagitis: Secondary | ICD-10-CM | POA: Diagnosis not present

## 2024-05-12 DIAGNOSIS — Z6831 Body mass index (BMI) 31.0-31.9, adult: Secondary | ICD-10-CM | POA: Diagnosis not present

## 2024-05-12 DIAGNOSIS — D751 Secondary polycythemia: Secondary | ICD-10-CM | POA: Diagnosis not present

## 2024-05-12 DIAGNOSIS — F17200 Nicotine dependence, unspecified, uncomplicated: Secondary | ICD-10-CM | POA: Diagnosis not present

## 2024-05-12 DIAGNOSIS — F5104 Psychophysiologic insomnia: Secondary | ICD-10-CM | POA: Diagnosis not present

## 2024-05-12 DIAGNOSIS — F17210 Nicotine dependence, cigarettes, uncomplicated: Secondary | ICD-10-CM | POA: Diagnosis not present

## 2024-05-12 DIAGNOSIS — R300 Dysuria: Secondary | ICD-10-CM | POA: Diagnosis not present

## 2024-05-12 DIAGNOSIS — Z1331 Encounter for screening for depression: Secondary | ICD-10-CM | POA: Diagnosis not present

## 2024-05-12 DIAGNOSIS — I671 Cerebral aneurysm, nonruptured: Secondary | ICD-10-CM | POA: Diagnosis not present

## 2024-05-17 NOTE — Progress Notes (Signed)
 err

## 2024-09-06 DIAGNOSIS — Z2821 Immunization not carried out because of patient refusal: Secondary | ICD-10-CM | POA: Diagnosis not present

## 2024-09-06 DIAGNOSIS — F17200 Nicotine dependence, unspecified, uncomplicated: Secondary | ICD-10-CM | POA: Diagnosis not present

## 2024-09-06 DIAGNOSIS — J449 Chronic obstructive pulmonary disease, unspecified: Secondary | ICD-10-CM | POA: Diagnosis not present

## 2024-09-06 DIAGNOSIS — R519 Headache, unspecified: Secondary | ICD-10-CM | POA: Diagnosis not present

## 2024-09-06 DIAGNOSIS — R0602 Shortness of breath: Secondary | ICD-10-CM | POA: Diagnosis not present

## 2024-09-13 ENCOUNTER — Other Ambulatory Visit: Payer: Self-pay | Admitting: Family Medicine

## 2024-09-13 DIAGNOSIS — J984 Other disorders of lung: Secondary | ICD-10-CM

## 2024-09-21 ENCOUNTER — Ambulatory Visit

## 2024-09-22 ENCOUNTER — Other Ambulatory Visit: Payer: Self-pay | Admitting: Orthopedic Surgery

## 2024-09-22 DIAGNOSIS — M25552 Pain in left hip: Secondary | ICD-10-CM | POA: Diagnosis not present

## 2024-09-28 ENCOUNTER — Ambulatory Visit

## 2024-10-06 ENCOUNTER — Ambulatory Visit
Admission: RE | Admit: 2024-10-06 | Discharge: 2024-10-06 | Disposition: A | Source: Ambulatory Visit | Attending: Orthopedic Surgery | Admitting: Orthopedic Surgery

## 2024-10-06 ENCOUNTER — Ambulatory Visit: Admission: RE | Admit: 2024-10-06 | Source: Ambulatory Visit

## 2024-10-06 DIAGNOSIS — M25552 Pain in left hip: Secondary | ICD-10-CM | POA: Diagnosis present

## 2024-10-06 DIAGNOSIS — J984 Other disorders of lung: Secondary | ICD-10-CM | POA: Diagnosis present

## 2024-10-10 ENCOUNTER — Ambulatory Visit
Admission: RE | Admit: 2024-10-10 | Discharge: 2024-10-10 | Disposition: A | Discharge status: Home / Self Care | Source: Ambulatory Visit | Attending: Family Medicine | Admitting: Family Medicine

## 2024-10-10 ENCOUNTER — Other Ambulatory Visit: Payer: Self-pay | Admitting: Family Medicine

## 2024-10-10 DIAGNOSIS — R600 Localized edema: Secondary | ICD-10-CM | POA: Diagnosis present

## 2024-10-10 NOTE — Progress Notes (Signed)
 Chief Complaint  Patient presents with   Foot Swelling    left   Groin Pain    10/10    Patient is agreeable to [employer] scribe.   History of Present Illness Kathryn Houston is an 83 year old female with COPD who presents with shortness of breath and persistent groin pain.  She experiences persistent groin pain rated as 10 out of 10, with occasional swelling in the left foot. The pain has been ongoing since November, and she has been taking Lodine, two tablets every six hours, for three to four days. Her daughter-in-law mentioned that Lodine might help with inflammation. She also notes difficulty getting into bed due to a higher mattress, which may be contributing to the groin pain.  She has a history of shortness of breath and was started on Advair for suspected emphysema/COPD. A chest x-ray in November showed a concerning density in the right lobe, and a follow-up CT scan was recommended. She uses an albuterol  inhaler more frequently than prescribed due to difficulty breathing, especially in the mornings when she experiences wheezing. The inhaler helps alleviate her symptoms.  She smokes in her bedroom. She acknowledges that smoking may be affecting her breathing. She has a sedentary lifestyle and poor nutrition.  No significant changes in urine output, although she has been drinking less due to difficulty accessing the bathroom. She initially thought her ankle swelling was due to dehydration, but her children disagreed, noting significant swelling.    ROS  Review of systems is unremarkable for any active cardiac, respiratory, GI, GU, hematologic, neurologic, dermatologic, HEENT, or psychiatric symptoms except as noted above.  No fevers, chills, or constitutional symptoms.   Current Outpatient Medications  Medication Sig Dispense Refill   albuterol  MDI, PROVENTIL , VENTOLIN , PROAIR , HFA 90 mcg/actuation inhaler Inhale 2 inhalations into the lungs every 6 (six) hours as needed  for Wheezing 1 each 1   amLODIPine  (NORVASC ) 2.5 MG tablet TAKE 1 TABLET(2.5 MG) BY MOUTH DAILY 90 tablet 1   aspirin  81 MG EC tablet Take 81 mg by mouth once daily.     calcium carbonate-vitamin D3 (CALTRATE 600+D) 600 mg(1,500mg ) -400 unit tablet Take 1 tablet by mouth 2 (two) times daily with meals.     CRANBERRY EXTRACT (CRANBERRY ORAL) Take 1 tablet by mouth once daily.     doxepin  (SINEQUAN ) 50 MG capsule TAKE 1 TO 2 CAPSULES BY MOUTH AT BEDTIME 180 capsule 1   pantoprazole  (PROTONIX ) 40 MG DR tablet TAKE 1 TABLET(40 MG) BY MOUTH TWICE DAILY 180 tablet 1   pravastatin  (PRAVACHOL ) 40 MG tablet TAKE 1 TABLET(40 MG) BY MOUTH AT BEDTIME 90 tablet 1   zolpidem  (AMBIEN ) 10 mg tablet Take 1 tablet (10 mg total) by mouth at bedtime 30 tablet 2   fluticasone  propion-salmeteroL (ADVAIR DISKUS) 250-50 mcg/dose diskus inhaler Inhale 1 Puff into the lungs every 12 (twelve) hours (Patient not taking: Reported on 10/10/2024) 60 each 12   meloxicam (MOBIC) 15 MG tablet Take 1 tablet (15 mg total) by mouth once daily for 14 days 14 tablet 0   No current facility-administered medications for this visit.    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  Exam BP (!) 162/88 (BP Location: Left upper arm, Patient Position: Sitting, BP Cuff Size: Adult)   Pulse 93   Ht 154.9 cm (5' 1)   Wt 82.4 kg (181 lb 9.6 oz)   LMP  (LMP Unknown)   SpO2 95%   BMI 34.31 kg/m   General. Well appearing; NAD; VS reviewed     Eyes. Sclera and conjunctiva clear; Vision grossly intact; extraocular movements intact Oropharynx. No suspicious lesions Neck. Supple. No swelling, masses, thyroid  normal size, no masses palpated.   Lungs. Respirations unlabored; mild wheezing but no rhonchi or crackles. Cardiovascular. Heart regular rate and rhythm without murmurs, gallops, or rubs Extremities: Mild nonpitting edema throughout the left lower leg.  No swelling in the right leg.  No tenderness when palpating the left leg.  No erythema or warmth in the left leg. Skin. Normal color and turgor  Assessment & Plan  Left lower extremity edema, rule out deep vein thrombosis Unilateral swelling with concern for DVT. Differential includes DVT, heart failure, or arthritis-related swelling. Risk factors: sedentary lifestyle, smoking. Ultrasound needed to rule out DVT.  Do not suspect heart failure or kidney failure at this time.  Swelling is unilateral. - Ordered ultrasound of the left lower extremity to rule out DVT.   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 Increased albuterol  usage noted. Discussed potential referral to pulmonologist for better inhaler options like Trelegy. - Continue current inhaler regimen with albuterol  as needed. - Referred to pulmonologist for evaluation and potential adjustment of inhaler therapy. - Advised smoking cessation to improve COPD management.  Pulmonary density, pending CT results Pulmonary density identified on chest x-ray. Awaiting CT results to determine nature of nodule. - Await CT scan results to evaluate  pulmonary nodule. - Will discuss results with her once available.  Left hip pain Chronic left hip pain, possibly exacerbated by recent changes in sleeping arrangements and increased weight-bearing. Pain management challenging due to potential medication interactions. - Will consider meloxicam for pain management if no DVT is found. - Will monitor pain and adjust treatment as needed.  Tobacco use disorder Long-standing tobacco use disorder contributing to COPD and overall health decline. Smoking cessation critical for improving respiratory health. - Advised smoking cessation. - Discussed potential benefits of smoking cessation on COPD and overall health.    F/U: Patient follow-up as needed.  Ultrasound pending.  JASON HESTLE WHITAKER, PA  This note has been created using automated tools and reviewed for accuracy by JASON HESTLE WHITAKER.   Note: This dictation was prepared with Dragon dictation along with smaller phrase technology. Any transcriptional errors that result from this process are unintentional.

## 2024-10-13 ENCOUNTER — Observation Stay

## 2024-10-13 ENCOUNTER — Observation Stay
Admission: EM | Admit: 2024-10-13 | Discharge: 2024-10-15 | Disposition: A | Discharge status: Home / Self Care | Attending: Emergency Medicine | Admitting: Emergency Medicine

## 2024-10-13 ENCOUNTER — Emergency Department

## 2024-10-13 ENCOUNTER — Other Ambulatory Visit: Payer: Self-pay

## 2024-10-13 DIAGNOSIS — R918 Other nonspecific abnormal finding of lung field: Secondary | ICD-10-CM

## 2024-10-13 DIAGNOSIS — Z7982 Long term (current) use of aspirin: Secondary | ICD-10-CM | POA: Insufficient documentation

## 2024-10-13 DIAGNOSIS — E876 Hypokalemia: Secondary | ICD-10-CM | POA: Diagnosis not present

## 2024-10-13 DIAGNOSIS — F17210 Nicotine dependence, cigarettes, uncomplicated: Secondary | ICD-10-CM | POA: Diagnosis not present

## 2024-10-13 DIAGNOSIS — R030 Elevated blood-pressure reading, without diagnosis of hypertension: Secondary | ICD-10-CM | POA: Diagnosis not present

## 2024-10-13 DIAGNOSIS — I10 Essential (primary) hypertension: Secondary | ICD-10-CM

## 2024-10-13 DIAGNOSIS — R1030 Lower abdominal pain, unspecified: Secondary | ICD-10-CM | POA: Diagnosis not present

## 2024-10-13 DIAGNOSIS — R0602 Shortness of breath: Secondary | ICD-10-CM | POA: Diagnosis present

## 2024-10-13 DIAGNOSIS — Z79899 Other long term (current) drug therapy: Secondary | ICD-10-CM | POA: Insufficient documentation

## 2024-10-13 DIAGNOSIS — G8929 Other chronic pain: Secondary | ICD-10-CM

## 2024-10-13 DIAGNOSIS — J9601 Acute respiratory failure with hypoxia: Secondary | ICD-10-CM

## 2024-10-13 DIAGNOSIS — J441 Chronic obstructive pulmonary disease with (acute) exacerbation: Secondary | ICD-10-CM | POA: Diagnosis not present

## 2024-10-13 HISTORY — DX: Chronic obstructive pulmonary disease, unspecified: J44.9

## 2024-10-13 LAB — CBC
HCT: 39.9 % (ref 36.0–46.0)
Hemoglobin: 11.7 g/dL — ABNORMAL LOW (ref 12.0–15.0)
MCH: 23.7 pg — ABNORMAL LOW (ref 26.0–34.0)
MCHC: 29.3 g/dL — ABNORMAL LOW (ref 30.0–36.0)
MCV: 80.9 fL (ref 80.0–100.0)
Platelets: 377 K/uL (ref 150–400)
RBC: 4.93 MIL/uL (ref 3.87–5.11)
RDW: 18.5 % — ABNORMAL HIGH (ref 11.5–15.5)
WBC: 8.5 K/uL (ref 4.0–10.5)
nRBC: 0 % (ref 0.0–0.2)

## 2024-10-13 LAB — BASIC METABOLIC PANEL WITH GFR
Anion gap: 11 (ref 5–15)
BUN: 10 mg/dL (ref 8–23)
CO2: 27 mmol/L (ref 22–32)
Calcium: 9 mg/dL (ref 8.9–10.3)
Chloride: 108 mmol/L (ref 98–111)
Creatinine, Ser: 0.85 mg/dL (ref 0.44–1.00)
GFR, Estimated: >60 mL/min
Glucose, Bld: 103 mg/dL — ABNORMAL HIGH (ref 70–99)
Potassium: 3.1 mmol/L — ABNORMAL LOW (ref 3.5–5.1)
Sodium: 146 mmol/L — ABNORMAL HIGH (ref 135–145)

## 2024-10-13 LAB — TROPONIN T, HIGH SENSITIVITY
Troponin T High Sensitivity: 16 ng/L (ref 0–19)
Troponin T High Sensitivity: 17 ng/L (ref 0–19)

## 2024-10-13 LAB — PRO BRAIN NATRIURETIC PEPTIDE: Pro Brain Natriuretic Peptide: 406 pg/mL — ABNORMAL HIGH

## 2024-10-13 MED ORDER — PANTOPRAZOLE SODIUM 40 MG PO TBEC
40.0000 mg | DELAYED_RELEASE_TABLET | Freq: Every day | ORAL | Status: DC
  Administered 2024-10-13 – 2024-10-15 (×3): 40 mg via ORAL
  Filled 2024-10-13 (×3): qty 1

## 2024-10-13 MED ORDER — PRAVASTATIN SODIUM 20 MG PO TABS
80.0000 mg | ORAL_TABLET | Freq: Every day | ORAL | Status: DC
  Administered 2024-10-14 – 2024-10-15 (×2): 80 mg via ORAL
  Filled 2024-10-13 (×2): qty 4

## 2024-10-13 MED ORDER — DOXEPIN HCL 50 MG PO CAPS: 50.0000 mg | ORAL_CAPSULE | Freq: Every evening | ORAL | Status: DC | PRN

## 2024-10-13 MED ORDER — ALBUTEROL SULFATE (2.5 MG/3ML) 0.083% IN NEBU: 2.5000 mg | INHALATION_SOLUTION | RESPIRATORY_TRACT | Status: DC | PRN

## 2024-10-13 MED ORDER — IPRATROPIUM-ALBUTEROL 0.5-2.5 (3) MG/3ML IN SOLN
3.0000 mL | Freq: Four times a day (QID) | RESPIRATORY_TRACT | Status: DC
  Administered 2024-10-13 – 2024-10-14 (×6): 3 mL via RESPIRATORY_TRACT
  Filled 2024-10-13 (×6): qty 3

## 2024-10-13 MED ORDER — ONDANSETRON HCL 4 MG PO TABS: 4.0000 mg | ORAL_TABLET | Freq: Four times a day (QID) | ORAL | Status: DC | PRN

## 2024-10-13 MED ORDER — AZITHROMYCIN 250 MG PO TABS
250.0000 mg | ORAL_TABLET | Freq: Every day | ORAL | Status: DC
  Administered 2024-10-14 – 2024-10-15 (×2): 250 mg via ORAL
  Filled 2024-10-13 (×2): qty 1

## 2024-10-13 MED ORDER — SODIUM CHLORIDE 0.9 % IV SOLN
1.0000 g | Freq: Once | INTRAVENOUS | Status: AC
  Administered 2024-10-13: 1 g via INTRAVENOUS
  Filled 2024-10-13: qty 10

## 2024-10-13 MED ORDER — ACETAMINOPHEN 650 MG RE SUPP: 650.0000 mg | Freq: Four times a day (QID) | RECTAL | Status: DC | PRN

## 2024-10-13 MED ORDER — ZOLPIDEM TARTRATE 5 MG PO TABS
5.0000 mg | ORAL_TABLET | Freq: Every evening | ORAL | Status: DC | PRN
  Administered 2024-10-13 – 2024-10-14 (×2): 5 mg via ORAL
  Filled 2024-10-13 (×2): qty 1

## 2024-10-13 MED ORDER — ENOXAPARIN SODIUM 40 MG/0.4ML IJ SOSY
40.0000 mg | PREFILLED_SYRINGE | INTRAMUSCULAR | Status: DC
  Administered 2024-10-13 – 2024-10-15 (×3): 40 mg via SUBCUTANEOUS
  Filled 2024-10-13 (×3): qty 0.4

## 2024-10-13 MED ORDER — ACETAMINOPHEN 325 MG PO TABS
650.0000 mg | ORAL_TABLET | Freq: Four times a day (QID) | ORAL | Status: DC | PRN
  Administered 2024-10-13: 650 mg via ORAL
  Filled 2024-10-13: qty 2

## 2024-10-13 MED ORDER — AMLODIPINE BESYLATE 5 MG PO TABS: 5.0000 mg | ORAL_TABLET | Freq: Every day | ORAL | Status: DC

## 2024-10-13 MED ORDER — POTASSIUM CHLORIDE CRYS ER 20 MEQ PO TBCR
40.0000 meq | EXTENDED_RELEASE_TABLET | Freq: Once | ORAL | Status: AC
  Administered 2024-10-13: 40 meq via ORAL
  Filled 2024-10-13: qty 2

## 2024-10-13 MED ORDER — IPRATROPIUM-ALBUTEROL 0.5-2.5 (3) MG/3ML IN SOLN
3.0000 mL | Freq: Once | RESPIRATORY_TRACT | Status: AC
  Administered 2024-10-13: 3 mL via RESPIRATORY_TRACT
  Filled 2024-10-13: qty 3

## 2024-10-13 MED ORDER — LISINOPRIL 10 MG PO TABS
10.0000 mg | ORAL_TABLET | Freq: Every day | ORAL | Status: DC
  Administered 2024-10-13 – 2024-10-15 (×3): 10 mg via ORAL
  Filled 2024-10-13 (×3): qty 1

## 2024-10-13 MED ORDER — IOHEXOL 350 MG/ML SOLN
75.0000 mL | Freq: Once | INTRAVENOUS | Status: AC | PRN
  Administered 2024-10-13: 75 mL via INTRAVENOUS

## 2024-10-13 MED ORDER — ONDANSETRON HCL 4 MG/2ML IJ SOLN: 4.0000 mg | Freq: Four times a day (QID) | INTRAMUSCULAR | Status: DC | PRN

## 2024-10-13 MED ORDER — FLUTICASONE FUROATE-VILANTEROL 100-25 MCG/ACT IN AEPB
1.0000 | INHALATION_SPRAY | Freq: Every day | RESPIRATORY_TRACT | Status: DC
  Filled 2024-10-13 (×2): qty 28

## 2024-10-13 MED ORDER — METHYLPREDNISOLONE SODIUM SUCC 40 MG IJ SOLR
40.0000 mg | Freq: Two times a day (BID) | INTRAMUSCULAR | Status: DC
  Administered 2024-10-13: 40 mg via INTRAVENOUS
  Filled 2024-10-13: qty 1

## 2024-10-13 MED ORDER — AZITHROMYCIN 500 MG PO TABS
500.0000 mg | ORAL_TABLET | Freq: Once | ORAL | Status: AC
  Administered 2024-10-13: 500 mg via ORAL
  Filled 2024-10-13: qty 1

## 2024-10-13 MED ORDER — NICOTINE 21 MG/24HR TD PT24
21.0000 mg | MEDICATED_PATCH | Freq: Every day | TRANSDERMAL | Status: DC
  Administered 2024-10-13 – 2024-10-15 (×3): 21 mg via TRANSDERMAL
  Filled 2024-10-13 (×3): qty 1

## 2024-10-13 MED ORDER — PREDNISONE 20 MG PO TABS: 40.0000 mg | ORAL_TABLET | Freq: Every day | ORAL | Status: DC

## 2024-10-13 MED ORDER — METHYLPREDNISOLONE SODIUM SUCC 125 MG IJ SOLR
125.0000 mg | Freq: Once | INTRAMUSCULAR | Status: AC
  Administered 2024-10-13: 125 mg via INTRAVENOUS
  Filled 2024-10-13: qty 2

## 2024-10-13 MED ORDER — ASPIRIN 81 MG PO TBEC
81.0000 mg | DELAYED_RELEASE_TABLET | Freq: Every day | ORAL | Status: DC
  Administered 2024-10-13 – 2024-10-15 (×3): 81 mg via ORAL
  Filled 2024-10-13 (×3): qty 1

## 2024-10-13 MED ORDER — HYDROCHLOROTHIAZIDE 12.5 MG PO TABS
12.5000 mg | ORAL_TABLET | Freq: Every day | ORAL | Status: DC
  Administered 2024-10-13 – 2024-10-15 (×3): 12.5 mg via ORAL
  Filled 2024-10-13 (×3): qty 1

## 2024-10-13 NOTE — Plan of Care (Signed)
  Problem: Education: Goal: Knowledge of disease or condition will improve Outcome: Progressing   Problem: Education: Goal: Knowledge of the prescribed therapeutic regimen will improve Outcome: Progressing

## 2024-10-13 NOTE — ED Triage Notes (Signed)
 First nurse note: pt to ED [facility] for shob for a few days. 90-93% on RA, duoneb PTA 20g left hand. 2 L [place] with EMS.

## 2024-10-13 NOTE — ED Notes (Signed)
 Called lab to get repeat trop

## 2024-10-13 NOTE — H&P (Signed)
 " History and Physical    Kathryn Houston FMW:[id number] DOB: 1941-05-06 DOA: 10/13/2024  PCP: [name], MD (Confirm with patient/family/NH records and if not entered, this has to be entered at [facility] point of entry) Patient coming from: Home  I have personally briefly reviewed patient's old medical records in [facility]  Chief Complaint: Wheezing, cough, SOB  HPI: Kathryn Houston is a 83 y.o. female with medical history significant of COPD, HLD, GERD, cigar smoking, presented with worsening of shortness of breath.  Symptoms started 3 weeks ago when patient started to have dry cough, wheezing and increasing shortness of breath.  At baseline she has poorly controlled left hip OA and pretty much sedentary lifestyle.  But she does feel increasing shortness of breath even at rest.  She also reported that the cough has been dry and she seemed to have more cough and night.  Meantime she has seen increasing bilateral ankle edema.  Went to see PCP who started patient on LABA+ ICS inhaler twice daily as well as as needed albuterol .  Initially patient did feel a little better however breathing symptoms gradually getting worse.  Went to see PCP again last week who ordered an outpatient CT chest and the DVT study.  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  Patient was referred to see pulmonary on January 7.  Denied any fever chills no chest pains no recent weight loss.  ED Course: Sinus rhythm, blood pressure elevated 150/100 O2 section 93% on room air stable around 2 L.  Chest x-ray showed right upper fields mass.  Outpatient CT without contrast on 12/18 showed right upper lobe mass with right paratracheal and right hilar lymphadenopathy.  Review of Systems: As per HPI otherwise 14 point review of systems negative.    Past Medical History:  Diagnosis Date   COPD (chronic obstructive pulmonary disease) (HCC)    GERD (gastroesophageal reflux disease)     Hyperlipidemia     Past Surgical History:  Procedure Laterality Date   APPENDECTOMY     BRAIN SURGERY     for aneurysm     reports that she has been smoking cigarettes. She has never used smokeless tobacco. She reports that she does not drink alcohol  and does not use drugs.  Allergies[1]  History reviewed. No pertinent family history.   Prior to Admission medications  Medication Sig Start Date End Date Taking? Authorizing Provider  aspirin  EC 81 MG tablet Take 1 tablet by mouth daily.     [provider]  CRANBERRY EXTRACT PO Take 1 tablet by mouth daily.    [provider]  doxepin  (SINEQUAN ) 50 MG capsule Take 50-100 mg by mouth at bedtime. 12/12/21   [provider]  doxepin  (SINEQUAN ) 75 MG capsule Take 75-150 mg by mouth at bedtime as needed. 11/20/21   [provider]  pantoprazole  (PROTONIX ) 40 MG tablet Take 1 tablet by mouth 2 (two) times daily as needed. 08/12/16   [provider]  pravastatin  (PRAVACHOL ) 80 MG tablet Take 1 tablet by mouth daily. 08/12/16   [provider]  zolpidem  (AMBIEN  CR) 12.5 MG CR tablet Take 12.5 mg by mouth at bedtime as needed. 12/18/21   [provider]  zolpidem  (AMBIEN ) 10 MG tablet Take 1 tablet by mouth at bedtime. Patient not taking: Reported on 01/06/2022 07/31/16   [provider]    Physical Exam: Vitals:   10/13/24 1140 10/13/24 1143 10/13/24 1146 10/13/24 1200  BP: (!) 154/103   (!) 157/143  Pulse: 92   87  Resp:    20  Temp:      TempSrc:      SpO2: 92% 93% 95% 98%  Weight:      Height:        Constitutional: NAD, calm, comfortable Vitals:   10/13/24 1140 10/13/24 1143 10/13/24 1146 10/13/24 1200  BP: (!) 154/103   (!) 157/143  Pulse: 92   87  Resp:    20  Temp:      TempSrc:      SpO2: 92% 93% 95% 98%  Weight:      Height:       Eyes: PERRL, lids and conjunctivae normal ENMT: Mucous membranes are moist. Posterior pharynx clear of any exudate  or lesions.Normal dentition.  Neck: normal, supple, no masses, no thyromegaly Respiratory: Diminished breathing sound bilaterally, scattered wheezing more on the right side, no crackles.  Increasing respiratory effort. No accessory muscle use.  Cardiovascular: Regular rate and rhythm, no murmurs / rubs / gallops.  1+ extremity edema. 2+ pedal pulses. No carotid bruits.  Abdomen: no tenderness, no masses palpated. No hepatosplenomegaly. Bowel sounds positive.  Musculoskeletal: no clubbing / cyanosis. No joint deformity upper and lower extremities. Good ROM, no contractures. Normal muscle tone.  Skin: no rashes, lesions, ulcers. No induration Neurologic: CN 2-12 grossly intact. Sensation intact, DTR normal. Strength 5/5 in all 4.  Psychiatric: Normal judgment and insight. Alert and oriented x 3. Normal mood.     Labs on Admission: I have personally reviewed following labs and imaging studies  CBC: Recent Labs  Lab 10/13/24 1112  WBC 8.5  HGB 11.7*  HCT 39.9  MCV 80.9  PLT 377   Basic Metabolic Panel: Recent Labs  Lab 10/13/24 1112  NA 146*  K 3.1*  CL 108  CO2 27  GLUCOSE 103*  BUN 10  CREATININE 0.85  CALCIUM 9.0   GFR: Estimated Creatinine Clearance: 47.3 mL/min (by C-G formula based on SCr of 0.85 mg/dL). Liver Function Tests: No results for input(s): AST, ALT, ALKPHOS, BILITOT, PROT, ALBUMIN in the last 168 hours. No results for input(s): LIPASE, AMYLASE in the last 168 hours. No results for input(s): AMMONIA in the last 168 hours. Coagulation Profile: No results for input(s): INR, PROTIME in the last 168 hours. Cardiac Enzymes: No results for input(s): CKTOTAL, CKMB, CKMBINDEX, TROPONINI in the last 168 hours. BNP (last 3 results) Recent Labs    10/13/24 1112  PROBNP 406.0*   HbA1C: No results for input(s): HGBA1C in the last 72 hours. CBG: No results for input(s): GLUCAP in the last 168 hours. Lipid Profile: No results  for input(s): CHOL, HDL, LDLCALC, TRIG, CHOLHDL, LDLDIRECT in the last 72 hours. Thyroid  Function Tests: No results for input(s): TSH, T4TOTAL, FREET4, T3FREE, THYROIDAB in the last 72 hours. Anemia Panel: No results for input(s): VITAMINB12, FOLATE, FERRITIN, TIBC, IRON, RETICCTPCT in the last 72 hours. Urine analysis: No results found for: COLORURINE, APPEARANCEUR, LABSPEC, PHURINE, GLUCOSEU, HGBUR, BILIRUBINUR, KETONESUR, PROTEINUR, UROBILINOGEN, NITRITE, LEUKOCYTESUR  Radiological Exams on Admission: DG Chest 2 View Result Date: 10/13/2024 CLINICAL DATA:  Shortness of breath for 3 days. EXAM: DG CHEST 2V COMPARISON:  Chest x-ray 01/06/2022.  Chest CT 10/06/2024. FINDINGS: The cardio pericardial silhouette is enlarged. Soft tissue fullness noted right suprahilar region. Peripheral interstitial coarsening evident with some patchy airspace disease at the right base. No acute bony abnormality. IMPRESSION: 1. Masslike soft tissue fullness in the right suprahilar  region. See report for chest CT from 1 week ago. 2. Patchy airspace disease at the right base. I personally called these results to Dr. Jossie in the emergency department and referenced the chest CT of 10/06/2024. Electronically Signed   By: Camellia Candle M.D.   On: 10/13/2024 12:09    EKG: Independently reviewed.  Sinus rhythm, no acute ST changes.  Assessment/Plan Principal Problem:   COPD exacerbation (HCC) Active Problems:   COPD with acute exacerbation (HCC)   Mass of right lung  (please populate well all problems here in Problem List. (For example, if patient is on BP meds at home and you resume or decide to hold them, it is a problem that needs to be her. Same for CAD, COPD, HLD and so on)  Acute COPD exacerbation - Failed outpatient management - Continue IV Solu-Medrol  x 2 and then switch to p.o. prednisone  - Continue ICS plus LABA - DuoNebs and as needed  albuterol  - Incentive spirometry - Short course of p.o. azithromycin  -Ambulatory pulse ox tomorrow morning for home O2 evaluation. - Other DDx, patient's breathing symptoms appear to be disproportionate to her COPD symptoms, given there is a lung tumor as well as increasing bilateral lower extremity edema, will order a CTA study to rule out PE.  Outpatient DVT study was negative.  Given that patient also has prolonged undertreated COPD, also suspect new onset of right-sided CHF/cor pulmonale, will order echocardiogram.  Right-sided lung mass - Explained to the patient that given her breathing status, inpatient bronchoscopy and biopsy rather contraindicated.  Encouraged her to go to the scheduled pulmonary appointment on January 7 to discuss about bronchoscopy biopsy for pathology diagnosis and staging and outpatient follow-up with oncology. - Given the size of the tumor burden, we will have home O2 evaluation as well.  Elevated blood pressure without diagnosis of HTN - Start hydrochlorothiazide  and lisinopril   Hypokalemia - P.o. replacement  Cigarette smoking - Patient agreed to quit smoking - Nicotine  patch   DVT prophylaxis: Lovenox  Code Status: Full code Family Communication: Husband at bedside Disposition Plan: Expect less than 2 midnight hospital stay Consults called: None Admission status: Telemetry observation   Cort ONEIDA Mana MD [facility] Pager [phone]  10/13/2024, 2:12 PM       [1] No Known Allergies  "

## 2024-10-13 NOTE — ED Provider Notes (Signed)
 "  [facility] Provider Note   Event Date/Time   First MD Initiated Contact with Patient 10/13/24 1128     (approximate) History  Shortness of Breath  HPI Kathryn Houston is a 83 y.o. female with a stated past medical history of recently diagnosed emphysema who presents complaining of worsening shortness of breath.  Patient states that she continues to smoke a pack cigarettes a day and was initially seen in our triage with a an oxygen of 89% and was placed on 2 L nasal cannula.  Patient Dors is worsening shortness of breath with exertion and a sensation of chest tightness. ROS: Patient currently denies any vision changes, tinnitus, difficulty speaking, facial droop, sore throat, abdominal pain, nausea/vomiting/diarrhea, dysuria, or weakness/numbness/paresthesias in any extremity   Physical Exam  Triage Vital Signs: ED Triage Vitals  Encounter Vitals Group     BP 10/13/24 1106 (!) 189/112     Girls Systolic BP Percentile --      Girls Diastolic BP Percentile --      Boys Systolic BP Percentile --      Boys Diastolic BP Percentile --      Pulse Rate 10/13/24 1106 99     Resp 10/13/24 1106 20     Temp 10/13/24 1106 98.4 F (36.9 C)     Temp Source 10/13/24 1106 Oral     SpO2 10/13/24 1106 95 %     Weight 10/13/24 1109 160 lb (72.6 kg)     Height 10/13/24 1109 5' 1 (1.549 m)     Head Circumference --      Peak Flow --      Pain Score 10/13/24 1108 0     Pain Loc --      Pain Education --      Exclude from Growth Chart --    Most recent vital signs: Vitals:   10/13/24 1146 10/13/24 1200  BP:  (!) 157/143  Pulse:  87  Resp:  20  Temp:    SpO2: 95% 98%   General: Awake, oriented x4. CV:  Good peripheral perfusion. Resp:  Normal effort.  Expiratory wheezing over bilateral lung fields.  2 L nasal cannula in place Abd:  No distention. Other:  Elderly obese Caucasian female resting comfortably in no acute distress ED Results / Procedures /  Treatments  Labs (all labs ordered are listed, but only abnormal results are displayed) Labs Reviewed  BASIC METABOLIC PANEL WITH GFR - Abnormal; Notable for the following components:      Result Value   Sodium 146 (*)    Potassium 3.1 (*)    Glucose, Bld 103 (*)    All other components within normal limits  CBC - Abnormal; Notable for the following components:   Hemoglobin 11.7 (*)    MCH 23.7 (*)    MCHC 29.3 (*)    RDW 18.5 (*)    All other components within normal limits  PRO BRAIN NATRIURETIC PEPTIDE - Abnormal; Notable for the following components:   Pro Brain Natriuretic Peptide 406.0 (*)    All other components within normal limits  RESPIRATORY PANEL BY PCR  TROPONIN T, HIGH SENSITIVITY  TROPONIN T, HIGH SENSITIVITY   EKG ED ECG REPORT I, Artist MARLA Kerns, the attending physician, personally viewed and interpreted this ECG. Date: 10/13/2024 EKG Time: 1116 Rate: 96 Rhythm: normal sinus rhythm QRS Axis: normal Intervals: normal ST/T Wave abnormalities: normal Narrative Interpretation: no evidence of acute ischemia RADIOLOGY ED MD interpretation: 2  view chest x-ray shows masslike soft tissue fullness in the right suprahilar region with patchy airspace disease at the right base - All radiology independently interpreted and agree with radiology assessment Official radiology report(s): DG Chest 2 View Result Date: 10/13/2024 CLINICAL DATA:  Shortness of breath for 3 days. EXAM: DG CHEST 2V COMPARISON:  Chest x-ray 01/06/2022.  Chest CT 10/06/2024. FINDINGS: The cardio pericardial silhouette is enlarged. Soft tissue fullness noted right suprahilar region. Peripheral interstitial coarsening evident with some patchy airspace disease at the right base. No acute bony abnormality. IMPRESSION: 1. Masslike soft tissue fullness in the right suprahilar region. See report for chest CT from 1 week ago. 2. Patchy airspace disease at the right base. I personally called these results to Dr.  Jossie in the emergency department and referenced the chest CT of 10/06/2024. Electronically Signed   By: Camellia Candle M.D.   On: 10/13/2024 12:09   PROCEDURES: Critical Care performed: Yes, see critical care procedure note(s) Procedures MEDICATIONS ORDERED IN ED: Medications  aspirin  EC tablet 81 mg (has no administration in time range)  pravastatin  (PRAVACHOL ) tablet 80 mg (has no administration in time range)  doxepin  (SINEQUAN ) capsule 75-150 mg (has no administration in time range)  zolpidem  (AMBIEN ) tablet 5 mg (has no administration in time range)  pantoprazole  (PROTONIX ) EC tablet 40 mg (has no administration in time range)  enoxaparin  (LOVENOX ) injection 40 mg (has no administration in time range)  methylPREDNISolone  sodium succinate (SOLU-MEDROL ) 40 mg/mL injection 40 mg (has no administration in time range)    Followed by  predniSONE  (DELTASONE ) tablet 40 mg (has no administration in time range)  fluticasone  furoate-vilanterol (BREO ELLIPTA ) 100-25 MCG/ACT 1 puff (has no administration in time range)  ipratropium-albuterol  (DUONEB) 0.5-2.5 (3) MG/3ML nebulizer solution 3 mL (has no administration in time range)  albuterol  (PROVENTIL ) (2.5 MG/3ML) 0.083% nebulizer solution 2.5 mg (has no administration in time range)  acetaminophen  (TYLENOL ) tablet 650 mg (has no administration in time range)    Or  acetaminophen  (TYLENOL ) suppository 650 mg (has no administration in time range)  ondansetron  (ZOFRAN ) tablet 4 mg (has no administration in time range)    Or  ondansetron  (ZOFRAN ) injection 4 mg (has no administration in time range)  nicotine  (NICODERM CQ  - dosed in mg/24 hours) patch 21 mg (has no administration in time range)  hydrochlorothiazide  (HYDRODIURIL ) tablet 12.5 mg (has no administration in time range)  lisinopril  (ZESTRIL ) tablet 10 mg (has no administration in time range)  azithromycin  (ZITHROMAX ) tablet 250 mg (has no administration in time range)  potassium  chloride SA (KLOR-CON  M) CR tablet 40 mEq (has no administration in time range)  ipratropium-albuterol  (DUONEB) 0.5-2.5 (3) MG/3ML nebulizer solution 3 mL (3 mLs Nebulization Given 10/13/24 1200)  methylPREDNISolone  sodium succinate (SOLU-MEDROL ) 125 mg/2 mL injection 125 mg (125 mg Intravenous Given 10/13/24 1157)  cefTRIAXone  (ROCEPHIN ) 1 g in sodium chloride  0.9 % 100 mL IVPB (1 g Intravenous New Bag/Given 10/13/24 1344)  azithromycin  (ZITHROMAX ) tablet 500 mg (500 mg Oral Given 10/13/24 1343)   IMPRESSION / MDM / ASSESSMENT AND PLAN / ED COURSE  I reviewed the triage vital signs and the nursing notes.                             The patient is on the cardiac monitor to evaluate for evidence of arrhythmia and/or significant heart rate changes. Patient's presentation is most consistent with acute presentation with potential threat to  life or bodily function. 83 year old female presents for worsening shortness of breath DDx: COPD exacerbation, CHF exacerbation, lung cancer, PE, ACS Plan: CBC, BMP, BNP, troponin, chest x-ray, EKG DuoNeb, Solu-Medrol , azithromycin , Rocephin   Based on laboratory and radiologic evaluation, patient is having a COPD exacerbation with acute hypoxic respiratory failure requiring 2 L nasal cannula and require admission to the internal medicine service for further evaluation and management.  Patient is also noted to have a masslike opacity seen on previous CT as well as on chest x-ray here today concerning for likely malignancy that will need further workup.  Patient was informed on these results  Dispo: Admit to medicine   FINAL CLINICAL IMPRESSION(S) / ED DIAGNOSES   Final diagnoses:  COPD exacerbation (HCC)  Acute hypoxic respiratory failure (HCC)   Rx / DC Orders   ED Discharge Orders     None      Note:  This document was prepared using Dragon voice recognition software and may include unintentional dictation errors.   Kamoria Lucien K, MD 10/13/24  [phone]  "

## 2024-10-13 NOTE — ED Notes (Addendum)
 Pt o2 sat in 89-93% with good pleth. 2L [place] placed. O2 SAT @97 %

## 2024-10-13 NOTE — ED Notes (Signed)
 Fall bundle in place. Alert enough to know not to get out of bed without assistance

## 2024-10-13 NOTE — Plan of Care (Signed)
" °  Problem: Education: Goal: Knowledge of disease or condition will improve Outcome: Progressing Goal: Knowledge of the prescribed therapeutic regimen will improve Outcome: Progressing Goal: Individualized Educational Video(s) Outcome: Progressing   Problem: Activity: Goal: Ability to tolerate increased activity will improve Outcome: Progressing Goal: Will verbalize the importance of balancing activity with adequate rest periods Outcome: Progressing   Problem: Respiratory: Goal: Ability to maintain a clear airway will improve Outcome: Progressing Goal: Levels of oxygenation will improve Outcome: Progressing Goal: Ability to maintain adequate ventilation will improve Outcome: Progressing   Problem: Education: Goal: Knowledge of General Education information will improve Description: Including pain rating scale, medication(s)/side effects and non-pharmacologic comfort measures Outcome: Progressing   Problem: Health Behavior/Discharge Planning: Goal: Ability to manage health-related needs will improve Outcome: Progressing   Problem: Clinical Measurements: Goal: Ability to maintain clinical measurements within normal limits will improve Outcome: Progressing Goal: Will remain free from infection Outcome: Progressing Goal: Diagnostic test results will improve Outcome: Progressing Goal: Respiratory complications will improve Outcome: Progressing Goal: Cardiovascular complication will be avoided Outcome: Progressing   Problem: Nutrition: Goal: Adequate nutrition will be maintained Outcome: Progressing   Problem: Elimination: Goal: Will not experience complications related to bowel motility Outcome: Progressing Goal: Will not experience complications related to urinary retention Outcome: Progressing   Problem: Safety: Goal: Ability to remain free from injury will improve Outcome: Progressing   Problem: Pain Managment: Goal: General experience of comfort will improve  and/or be controlled Outcome: Progressing   Problem: Skin Integrity: Goal: Risk for impaired skin integrity will decrease Outcome: Progressing   "

## 2024-10-13 NOTE — ED Triage Notes (Signed)
 Pt to ED from home [facility] since about 3 days, hx COPD. 1 duoneb given PTA. Was placed on 2L but was 90-93% RA with EMS, oxygen turned off in triage and pt remains 93% on RA. Pt is waiting for CT results of L leg with PCP because has also been having hip and groin pain (only painful when she stands). Denies CP. Pt is current 1 pack/day smoker. Respirations are unlabored.

## 2024-10-14 ENCOUNTER — Observation Stay
Admit: 2024-10-14 | Discharge: 2024-10-14 | Disposition: A | Discharge status: Home / Self Care | Attending: Internal Medicine | Admitting: Internal Medicine

## 2024-10-14 DIAGNOSIS — G8929 Other chronic pain: Secondary | ICD-10-CM | POA: Diagnosis not present

## 2024-10-14 DIAGNOSIS — J9601 Acute respiratory failure with hypoxia: Secondary | ICD-10-CM | POA: Diagnosis not present

## 2024-10-14 DIAGNOSIS — R918 Other nonspecific abnormal finding of lung field: Secondary | ICD-10-CM

## 2024-10-14 DIAGNOSIS — F17210 Nicotine dependence, cigarettes, uncomplicated: Secondary | ICD-10-CM

## 2024-10-14 DIAGNOSIS — E876 Hypokalemia: Secondary | ICD-10-CM

## 2024-10-14 DIAGNOSIS — R1032 Left lower quadrant pain: Secondary | ICD-10-CM | POA: Diagnosis not present

## 2024-10-14 DIAGNOSIS — J441 Chronic obstructive pulmonary disease with (acute) exacerbation: Secondary | ICD-10-CM | POA: Diagnosis not present

## 2024-10-14 DIAGNOSIS — I10 Essential (primary) hypertension: Secondary | ICD-10-CM

## 2024-10-14 LAB — RESPIRATORY PANEL BY PCR

## 2024-10-14 LAB — BASIC METABOLIC PANEL WITH GFR
Anion gap: 10 (ref 5–15)
BUN: 14 mg/dL (ref 8–23)
CO2: 28 mmol/L (ref 22–32)
Calcium: 9 mg/dL (ref 8.9–10.3)
Chloride: 107 mmol/L (ref 98–111)
Creatinine, Ser: 0.97 mg/dL (ref 0.44–1.00)
GFR, Estimated: 58 mL/min — ABNORMAL LOW
Glucose, Bld: 136 mg/dL — ABNORMAL HIGH (ref 70–99)
Potassium: 3.6 mmol/L (ref 3.5–5.1)
Sodium: 145 mmol/L (ref 135–145)

## 2024-10-14 LAB — ECHOCARDIOGRAM COMPLETE
AR max vel: 2.95 cm2
AV Area VTI: 2.66 cm2
AV Area mean vel: 2.6 cm2
AV Mean grad: 4.5 mmHg
AV Peak grad: 7 mmHg
Ao pk vel: 1.32 m/s
Area-P 1/2: 4.71 cm2
Height: 61 in
MV VTI: 2.37 cm2
S' Lateral: 3 cm
Weight: 2560 [oz_av]

## 2024-10-14 LAB — MAGNESIUM: Magnesium: 2.2 mg/dL (ref 1.7–2.4)

## 2024-10-14 MED ORDER — PREDNISONE 20 MG PO TABS
40.0000 mg | ORAL_TABLET | Freq: Every day | ORAL | Status: DC
  Administered 2024-10-14 – 2024-10-15 (×2): 40 mg via ORAL
  Filled 2024-10-14 (×2): qty 2

## 2024-10-14 NOTE — Progress Notes (Signed)
 " Progress Note   Patient: Kathryn Houston FMW:[id number] DOB: 06-20-1941 DOA: 10/13/2024     0 DOS: the patient was seen and examined on 10/14/2024   Brief hospital course: Partly taken from H&P  Kathryn Houston is a 83 y.o. female with medical history significant of COPD, HLD, GERD, cigar smoking, presented with worsening of shortness of breath for about 3 weeks.  She was also having bilateral ankle edema. Patient has a recent CT chest at PCP office which shows a right sided lung mass suspicious for lung cancer, she was referred to see a pulmonary and a schedule appointment on January 7.  On presentation vital stable, saturating 93% on 2 L of oxygen.  Labs with mild hypokalemia at 3.1.  proBNP mildly elevated at 406.  Chest x-ray concerning for right upper lobe mass.  Patient was admitted for concern of COPD exacerbation.  And started on Solu-Medrol  with DuoNeb.  12/26: Vital stable with 100% saturation on 2 L of oxygen, patient developed worsening tremors with Solu-Medrol  so it was switched with prednisone .  Respiratory viral panel negative.  Assessment and Plan: * COPD exacerbation (HCC) Wheezing improved today.  Still feeling little short of breath. Developed significant tremors with Solu-Medrol  which is being switched with prednisone . - Continue with Zithromax  -Continue with bronchodilator and supportive care  Acute hypoxic respiratory failure (HCC) Likely secondary to COPD exacerbation, currently saturating in mid 90s on 2 L of oxygen with no baseline use. -Continue supplemental oxygen-wean as tolerated  Mass of right lung Recent diagnosis of right upper lobe mass, PCP apparently referred to pulmonary, concerning for malignancy.  Patient with lifelong history of smoking.  Family requesting inpatient consult. - Pulmonary was consulted. - Follow-up pulmonary recommendations  Chronic groin pain, left Patient with significant left groin pain going on for a while,  refusing to ambulate due to that.  PCP recently did CT of left hip which was done on 12/18 with pending results. - Requesting [facility] radiology to get the results -PT/OT evaluation -Continue with pain management  Essential hypertension Blood pressure currently within goal. -Continue HCTZ and lisinopril   Hypokalemia Improved with repletion.  Magnesium was normal. - Continue to monitor-replete as needed  Continuous dependence on cigarette smoking Counseling was provided. -Nicotine  patch as needed   Subjective: Patient was seen and examined today.  Complaining of left groin pain and refusing to move.  Still some shortness of breath.  Family at bedside and requesting to see a pulmonologist for concerning lung mass while in the hospital.  Physical Exam: Vitals:   10/14/24 0515 10/14/24 0805 10/14/24 0840 10/14/24 1431  BP: (!) 154/59  132/81   Pulse: 89  87   Resp: 18  17   Temp: 98.2 F (36.8 C)  98.7 F (37.1 C)   TempSrc:      SpO2: 95% 96% 100% 94%  Weight:      Height:       General.  Frail elderly lady, in no acute distress. Pulmonary.  Lungs clear bilaterally, normal respiratory effort. CV.  Regular rate and rhythm, no JVD, rub or murmur. Abdomen.  Soft, nontender, nondistended, BS positive. CNS.  Alert and oriented .  No focal neurologic deficit. Extremities.  No edema, pulses intact and symmetrical. Psychiatry.  Judgment and insight appears normal.   Data Reviewed: Prior data reviewed  Family Communication: Discussed with son at bedside  Disposition: Status is: Observation The patient will require care spanning > 2 midnights and should be moved to inpatient  because: Severity of illness  Planned Discharge Destination: Home with Home Health  DVT prophylaxis.  Lovenox  Time spent: 50 minutes  This record has been created using Conservation officer, historic buildings. Errors have been sought and corrected,but may not always be located. Such creation errors do not  reflect on the standard of care.   Author: Amaryllis Dare, MD 10/14/2024 3:24 PM  For on call review [email or web address].  "

## 2024-10-14 NOTE — Assessment & Plan Note (Signed)
 Patient with significant left groin pain going on for a while, refusing to ambulate due to that.  PCP recently did CT of left hip which was done on 12/18 with pending results. - Requesting [facility] radiology to get the results -PT/OT evaluation -Continue with pain management

## 2024-10-14 NOTE — Assessment & Plan Note (Signed)
 Recent diagnosis of right upper lobe mass, PCP apparently referred to pulmonary, concerning for malignancy.  Patient with lifelong history of smoking.  Family requesting inpatient consult. - Pulmonary was consulted. - Follow-up pulmonary recommendations

## 2024-10-14 NOTE — Assessment & Plan Note (Signed)
 Improved with repletion.  Magnesium was normal. - Continue to monitor-replete as needed

## 2024-10-14 NOTE — Hospital Course (Addendum)
 Partly taken from H&P  Kathryn Houston is a 83 y.o. female with medical history significant of COPD, HLD, GERD, cigar smoking, presented with worsening of shortness of breath for about 3 weeks.  She was also having bilateral ankle edema. Patient has a recent CT chest at PCP office which shows a right sided lung mass suspicious for lung cancer, she was referred to see a pulmonary and a schedule appointment on January 7.  On presentation vital stable, saturating 93% on 2 L of oxygen.  Labs with mild hypokalemia at 3.1.  proBNP mildly elevated at 406.  Chest x-ray concerning for right upper lobe mass.  Patient was admitted for concern of COPD exacerbation.  And started on Solu-Medrol  with DuoNeb.  12/26: Vital stable with 100% saturation on 2 L of oxygen, patient developed worsening tremors with Solu-Medrol  so it was switched with prednisone .  Respiratory viral panel negative.  12/27: Hemodynamically stable, intermittently requiring 2 L of oxygen-patient did qualify for home oxygen and it was ordered.  PT recommendations are for SNF but patient wants to go home with home health which was ordered. She was also given a rollator to help with ambulation.  Left groin pain seems better today.  Likely having mild arthritis exacerbation as CT of left hip was negative for any significant abnormalities except mild osteoarthritis.  There was no more wheezing today and patient seems comfortable.  She is being discharged on few more doses of Zithromax  and prednisone .  We discussed her case with pulmonology and they will be seeing her as outpatient within a week to schedule a bronchoscopy and further assistance with this concerning lung mass.  Patient agreeable to stop smoking and provided with nicotine  patch to help.  Patient will continue on current medications and need to have a close follow-up with her providers for further assistance.

## 2024-10-14 NOTE — Care Management Obs Status (Signed)
 MEDICARE OBSERVATION STATUS NOTIFICATION   Patient Details  Name: Kathryn Houston MRN: [id number] Date of Birth: August 01, 1941   Medicare Observation Status Notification Given:  Chaney BRANDY CHRISTIANE LELON, CMA 10/14/2024, 3:04 PM

## 2024-10-14 NOTE — Assessment & Plan Note (Signed)
 Blood pressure currently within goal. -Continue HCTZ and lisinopril 

## 2024-10-14 NOTE — Assessment & Plan Note (Signed)
 Wheezing improved today.  Still feeling little short of breath. Developed significant tremors with Solu-Medrol  which is being switched with prednisone . - Continue with Zithromax  -Continue with bronchodilator and supportive care

## 2024-10-14 NOTE — Progress Notes (Signed)
*  PRELIMINARY RESULTS* Echocardiogram 2D Echocardiogram has been performed.  Kathryn Houston 10/14/2024, 9:35 AM

## 2024-10-14 NOTE — Assessment & Plan Note (Signed)
Counseling was provided. -Nicotine patch as needed 

## 2024-10-14 NOTE — Assessment & Plan Note (Signed)
 Likely secondary to COPD exacerbation, currently saturating in mid 90s on 2 L of oxygen with no baseline use. -Continue supplemental oxygen-wean as tolerated

## 2024-10-14 NOTE — Evaluation (Signed)
 Physical Therapy Evaluation Patient Details Name: Kathryn Houston MRN: [id number] DOB: Oct 24, 1940 Today's Date: 10/14/2024  History of Present Illness  Karson Chicas is an 83yoF who comes to [facility] on 10/13/24 c SOB and Left hip/groin pain when standing (pt sent for Left hip CT last week by PCP with results still pending). 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 MD recommending scheduled OP pulmonology visit 1/7 and has asked PT evaluate for home O2 needs given the size of the tumor burden.  Clinical Impression  Pt in bed on arrival, 2L O2, but desaturates to 85% on room air. Pt has not significant pain of Left hip/groin but with bed mobility, transfers, and AMB, pain becomes fairly severe with overall poor tolerance to loading. Degree of pain with standing hip flexion is remarkably different and better. Pt educated on use of RW for safe offloading to maximize potential for overground mobility, however, pt is unable to cover much ground safely. Recommended pain medication to also help achieve best tolerance to household distances. Pt unable to achieve mobility needed for return to home, husband unable to provide any meaningful physical assistance. Will follow.       If plan is discharge home, recommend the following: A lot of help with walking and/or transfers;Help with stairs or ramp for entrance;Assist for transportation;Assistance with cooking/housework   Can travel by private vehicle   No    Equipment Recommendations Other (comment);BSC/3in1 (youth RW)  Recommendations for Other Services       Functional Status Assessment Patient has had a recent decline in their functional status and demonstrates the ability to make significant improvements in function in a reasonable and predictable amount of time.     Precautions / Restrictions Precautions Precautions: Fall      Mobility  Bed Mobility Overal bed  mobility: Needs Assistance Bed Mobility: Supine to Sit, Sit to Supine     Supine to sit: Contact guard, HOB elevated Sit to supine: Mod assist        Transfers Overall transfer level: Needs assistance Equipment used: Rolling walker (2 wheels) Transfers: Sit to/from Stand Sit to Stand: Contact guard assist           General transfer comment: max effort, at limit of pain tolerance    Ambulation/Gait Ambulation/Gait assistance: Contact guard assist Gait Distance (Feet): 8 Feet Assistive device: Rolling walker (2 wheels)         General Gait Details: severe pain with attempted FWB even with RW; PWB improved with RW compared to prior to admission; small fraction of pain with active hip flexion in standing.  Stairs            Wheelchair Mobility     Tilt Bed    Modified Rankin (Stroke Patients Only)       Balance                                             Pertinent Vitals/Pain Pain Assessment Pain Assessment: Faces Faces Pain Scale: Hurts worst Pain Location: Left hip during attempted full weightbearing Pain Intervention(s): Monitored during session    Home Living Family/patient expects to be discharged to:: Private residence Living Arrangements: Spouse/significant other (husband unable to provide any physical assistnace) Available Help at Discharge: Family (2 sons nearby, 1 very available, the other less.) Type  of Home: House         Home Layout: One level Home Equipment: Wheelchair - manual      Prior Function Prior Level of Function : Driving             Mobility Comments: as of 1 month ago, was full yindependent ADLs Comments: independent     Extremity/Trunk Assessment                Communication        Cognition Arousal: Alert Behavior During Therapy: WFL for tasks assessed/performed, Anxious   PT - Cognitive impairments: No apparent impairments                                  Cueing       General Comments      Exercises     Assessment/Plan    PT Assessment Patient needs continued PT services  PT Problem List Decreased strength;Decreased activity tolerance;Decreased balance;Decreased range of motion       PT Treatment Interventions DME instruction;Gait training;Patient/family education;Functional mobility training;Therapeutic activities;Therapeutic exercise;Balance training;Neuromuscular re-education    PT Goals (Current goals can be found in the Care Plan section)  Acute Rehab PT Goals Patient Stated Goal: have an answer about her pain PT Goal Formulation: With patient Time For Goal Achievement: 10/28/24 Potential to Achieve Goals: Fair    Frequency Min 3X/week     Co-evaluation               AM-PAC PT 6 Clicks Mobility  Outcome Measure Help needed turning from your back to your side while in a flat bed without using bedrails?: A Lot Help needed moving from lying on your back to sitting on the side of a flat bed without using bedrails?: A Lot Help needed moving to and from a bed to a chair (including a wheelchair)?: A Little Help needed standing up from a chair using your arms (e.g., wheelchair or bedside chair)?: A Little Help needed to walk in hospital room?: A Lot Help needed climbing 3-5 steps with a railing? : A Lot 6 Click Score: 14    End of Session Equipment Utilized During Treatment: Gait belt;Oxygen Activity Tolerance: Patient limited by pain;Patient limited by fatigue Patient left: in bed;with call bell/phone within reach;with bed alarm set   PT Visit Diagnosis: Other abnormalities of gait and mobility (R26.89);Difficulty in walking, not elsewhere classified (R26.2)    Time: 8456-8385 PT Time Calculation (min) (ACUTE ONLY): 31 min   Charges:   PT Evaluation $PT Eval High Complexity: 1 High PT Treatments $Self Care/Home Management: 8-22 PT General Charges $$ ACUTE PT VISIT: 1 Visit       4:25 PM,  10/14/2024 Peggye JAYSON Linear, PT, DPT Physical Therapist - [facility]  [phone] (ASCOM)    Kida Digiulio C 10/14/2024, 4:22 PM

## 2024-10-14 NOTE — Progress Notes (Signed)
 Oxygen Qualification Note  SpO2 on room air at rest = 85% SpO2 on 2 liters air at rest = 96% SpO2 on 2 liters of O2 while ambulating = 94%  Pt desaturates on room air with mobility and requires supplemental oxygen to maintain a safe saturation level.    4:29 PM, 10/14/2024 Peggye JAYSON Linear, PT, DPT Physical Therapist - [facility]  [phone] [facility])

## 2024-10-15 ENCOUNTER — Other Ambulatory Visit: Payer: Self-pay

## 2024-10-15 DIAGNOSIS — R1032 Left lower quadrant pain: Secondary | ICD-10-CM | POA: Diagnosis not present

## 2024-10-15 DIAGNOSIS — J9601 Acute respiratory failure with hypoxia: Secondary | ICD-10-CM | POA: Diagnosis not present

## 2024-10-15 DIAGNOSIS — J441 Chronic obstructive pulmonary disease with (acute) exacerbation: Secondary | ICD-10-CM | POA: Diagnosis not present

## 2024-10-15 DIAGNOSIS — F17210 Nicotine dependence, cigarettes, uncomplicated: Secondary | ICD-10-CM | POA: Diagnosis not present

## 2024-10-15 DIAGNOSIS — E876 Hypokalemia: Secondary | ICD-10-CM | POA: Diagnosis not present

## 2024-10-15 DIAGNOSIS — R918 Other nonspecific abnormal finding of lung field: Secondary | ICD-10-CM | POA: Diagnosis not present

## 2024-10-15 DIAGNOSIS — G8929 Other chronic pain: Secondary | ICD-10-CM | POA: Diagnosis not present

## 2024-10-15 DIAGNOSIS — I10 Essential (primary) hypertension: Secondary | ICD-10-CM | POA: Diagnosis not present

## 2024-10-15 MED ORDER — ARFORMOTEROL TARTRATE 15 MCG/2ML IN NEBU
15.0000 ug | INHALATION_SOLUTION | Freq: Two times a day (BID) | RESPIRATORY_TRACT | Status: DC
  Filled 2024-10-15 (×2): qty 2

## 2024-10-15 MED ORDER — PREDNISONE 20 MG PO TABS
40.0000 mg | ORAL_TABLET | Freq: Every day | ORAL | 0 refills | Status: AC
  Filled 2024-10-15: qty 8, 4d supply, fill #0

## 2024-10-15 MED ORDER — HYDROCHLOROTHIAZIDE 12.5 MG PO TABS
12.5000 mg | ORAL_TABLET | Freq: Every day | ORAL | 0 refills | Status: DC
  Filled 2024-10-15: qty 30, 30d supply, fill #0

## 2024-10-15 MED ORDER — LISINOPRIL 10 MG PO TABS
10.0000 mg | ORAL_TABLET | Freq: Every day | ORAL | 0 refills | Status: DC
  Filled 2024-10-15: qty 30, 30d supply, fill #0

## 2024-10-15 MED ORDER — IPRATROPIUM-ALBUTEROL 0.5-2.5 (3) MG/3ML IN SOLN: 3.0000 mL | RESPIRATORY_TRACT | Status: DC | PRN

## 2024-10-15 MED ORDER — AZITHROMYCIN 250 MG PO TABS
250.0000 mg | ORAL_TABLET | Freq: Every day | ORAL | 0 refills | Status: AC
  Filled 2024-10-15: qty 3, 3d supply, fill #0

## 2024-10-15 MED ORDER — NICOTINE 21 MG/24HR TD PT24
21.0000 mg | MEDICATED_PATCH | Freq: Every day | TRANSDERMAL | 0 refills | Status: AC
  Filled 2024-10-15: qty 14, 14d supply, fill #0

## 2024-10-15 NOTE — Plan of Care (Signed)
" °  Problem: Education: Goal: Knowledge of disease or condition will improve Outcome: Progressing Goal: Knowledge of the prescribed therapeutic regimen will improve Outcome: Progressing Goal: Individualized Educational Video(s) Outcome: Progressing   Problem: Activity: Goal: Ability to tolerate increased activity will improve Outcome: Progressing Goal: Will verbalize the importance of balancing activity with adequate rest periods Outcome: Progressing   Patient pushing herself to get up and walk and bear weight on left leg as she stated she was unable to do before. "

## 2024-10-15 NOTE — TOC Transition Note (Addendum)
 Transition of Care [facility]) - Discharge Note   Patient Details  Name: Kathryn Houston MRN: [id number] Date of Birth: June 26, 1941  [facility]) CM/SW Contact:  Victory Jackquline RAMAN, RN Phone Number: 10/15/2024, 1:03 PM   Clinical Narrative:   RNCM received a  message from the MD via secure chat informing me that the patient was discharging home and needed Oxygen and RW and that the son was on the way to pick up the patient. RNCM Sent an email to [employer] informing them of DME needs and to be delivered to the patient's bedside. No further concerns. RNCM signing off.    Final next level of care: Home/Self Care Barriers to Discharge: Barriers Resolved   Patient Goals and CMS Choice            Discharge Placement                Patient to be transferred to facility by: Son Name of family member notified: Tanda Patient and family notified of of transfer: 10/15/24  Discharge Plan and Services Additional resources added to the After Visit Summary for                  DME Arranged: Walker rolling, Oxygen DME Agency: [employer] Date DME Agency Contacted: 10/15/24   Representative spoke with at DME Agency: Email sent to [employer] for DME            Social Drivers of Health (SDOH) Interventions SDOH Screenings   Food Insecurity: Patient Declined (10/13/2024)  Housing: Patient Declined (10/13/2024)  Transportation Needs: Patient Declined (10/13/2024)  Utilities: Patient Declined (10/13/2024)  Financial Resource Strain: Low Risk  (05/12/2024)   Received from [facility]  Social Connections: Unknown (10/13/2024)  Tobacco Use: High Risk (10/13/2024)     Readmission Risk Interventions     No data to display

## 2024-10-15 NOTE — TOC CM/SW Note (Signed)
" °  °  Durable Medical Equipment  (From admission, onward)           Start     Ordered   10/15/24 1043  For home use only DME 4 wheeled rolling walker with seat  Once       Question:  Patient needs a walker to treat with the following condition  Answer:  Generalized weakness   10/15/24 1042   10/15/24 1041  For home use only DME oxygen  Once       Question Answer Comment  Length of Need Lifetime   Mode or (Route) Nasal cannula   Liters per Minute 2   Frequency Continuous (stationary and portable oxygen unit needed)   Oxygen conserving device Yes   Oxygen delivery system: Gas   Oxygen delivery system: Concentrator   Oxygen delivery system: Portable concentrator (POC)      10/15/24 1041            "

## 2024-10-15 NOTE — TOC CM/SW Note (Signed)
 Rolling Walker: The beneficiary has a mobility limitation that significantly impairs his/her ability to participate in one or more mobility-related activities of daily living (MRADL) in the home. The patient is able to safely use the walker. The functional mobility deficit can be sufficiently resolved by use of walker.

## 2024-10-15 NOTE — Progress Notes (Signed)
 Physical Therapy Treatment Patient Details Name: Kathryn Houston MRN: [id number] DOB: 1941-06-05 Today's Date: 10/15/2024   History of Present Illness Kathryn Houston is an 83yoF who comes to [facility] on 10/13/24 c SOB and Left hip/groin pain when standing (pt sent for Left hip CT last week by PCP with results still pending). 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 MD recommending scheduled OP pulmonology visit 1/7 and has asked PT evaluate for home O2 needs given the size of the tumor burden.    PT Comments  Pt progressed with gait (RW, 15' antalgic gait pattern) and tolerated transferring to recliner to sit up to eat meal. Pt on 2 LO2 throughout session. Pt continues to experience limitations to mobility, demonstrating decreased activity tolerance, general weakness, and severe L LE pain with movement and weight bearing.   Continue to recommend post acute rehab <3 hours therapy/day upon d/c.      If plan is discharge home, recommend the following: A lot of help with walking and/or transfers;Help with stairs or ramp for entrance;Assist for transportation;Assistance with cooking/housework   Can travel by private vehicle     No  Equipment Recommendations       Recommendations for Other Services       Precautions / Restrictions Precautions Precautions: Fall Restrictions Weight Bearing Restrictions Per Provider Order: No     Mobility  Bed Mobility Overal bed mobility: Needs Assistance Bed Mobility: Supine to Sit, Sit to Supine     Supine to sit: HOB elevated, Used rails, Supervision     General bed mobility comments: Increase in L LE pain with movement.    Transfers Overall transfer level: Needs assistance Equipment used: Rolling walker (2 wheels) Transfers: Sit to/from Stand, Bed to chair/wheelchair/BSC Sit to Stand: Contact guard assist   Step pivot transfers: Contact guard assist        General transfer comment: increased time and an increase in pain with movement, pt able to tolerate with increased UE weight bearing through RW.  On 2 LO2 throughout session, SPO2 >90%.    Ambulation/Gait Ambulation/Gait assistance: Contact guard assist Gait Distance (Feet): 15 Feet Assistive device: Rolling walker (2 wheels) Gait Pattern/deviations: Step-to pattern, Antalgic, Decreased stride length, Shuffle       General Gait Details: Increased pain with ambulation; pt able to tolerate with increased UE weight bearing through [employer]             Wheelchair Mobility     Tilt Bed    Modified Rankin (Stroke Patients Only)       Balance Overall balance assessment: Needs assistance   Sitting balance-Leahy Scale: Good Sitting balance - Comments: increased L LE pain with weight bearing   Standing balance support: Bilateral upper extremity supported, Reliant on assistive device for balance, During functional activity Standing balance-Leahy Scale: Fair Standing balance comment: good upper trunk stability but increased weight bearing through UEs 2/2 L LE pain.                            Communication Communication Communication: No apparent difficulties  Cognition Arousal: Alert Behavior During Therapy: WFL for tasks assessed/performed   PT - Cognitive impairments: No apparent impairments                         Following commands: Intact      Cueing  Cueing Techniques: Verbal cues, Gestural cues  Exercises      General Comments        Pertinent Vitals/Pain Pain Assessment Pain Score: 8  Pain Location: Left hip during attempted full weightbearing Pain Descriptors / Indicators: Sharp Pain Intervention(s): Monitored during session, Other (comment) (Pt did not want pain meds)    Home Living                          Prior Function            PT Goals (current goals can now be found in the care plan section) Acute  Rehab PT Goals Patient Stated Goal: have an answer about her pain PT Goal Formulation: With patient Time For Goal Achievement: 10/28/24 Potential to Achieve Goals: Fair Progress towards PT goals: Progressing toward goals    Frequency    Min 3X/week      PT Plan      Co-evaluation              AM-PAC PT 6 Clicks Mobility   Outcome Measure  Help needed turning from your back to your side while in a flat bed without using bedrails?: A Lot Help needed moving from lying on your back to sitting on the side of a flat bed without using bedrails?: A Lot Help needed moving to and from a bed to a chair (including a wheelchair)?: A Little Help needed standing up from a chair using your arms (e.g., wheelchair or bedside chair)?: A Little Help needed to walk in hospital room?: A Lot Help needed climbing 3-5 steps with a railing? : A Lot 6 Click Score: 14    End of Session Equipment Utilized During Treatment: Gait belt;Oxygen Activity Tolerance: Patient limited by fatigue Patient left: in chair;with chair alarm set;with call bell/phone within reach Nurse Communication: Mobility status PT Visit Diagnosis: Other abnormalities of gait and mobility (R26.89);Difficulty in walking, not elsewhere classified (R26.2)     Time: 9099-9076 PT Time Calculation (min) (ACUTE ONLY): 23 min  Charges:    $Therapeutic Activity: 23-37 mins PT General Charges $$ ACUTE PT VISIT: 1 Visit                     Harland Irving, PTA  10/15/2024, 9:43 AM

## 2024-10-15 NOTE — Discharge Summary (Signed)
 " Physician Discharge Summary   Patient: Kathryn Houston MRN: [id number] DOB: 27-Dec-1940  Admit date:     10/13/2024  Discharge date: 10/15/2024  Discharge Physician: Amaryllis Dare   PCP: [name], MD   Recommendations at discharge:  Please obtain CBC and BMP on follow-up Follow-up with pulmonology-patient need bronchoscopy and biopsy of concerning lung mass. Follow-up with primary care provider  Discharge Diagnoses: Principal Problem:   COPD exacerbation (HCC) Active Problems:   Acute hypoxic respiratory failure (HCC)   Mass of right lung   Chronic groin pain, left   Essential hypertension   Hypokalemia   Continuous dependence on cigarette smoking   COPD with acute exacerbation [facility])   Hospital Course: Partly taken from H&P  Kathryn Houston is a 83 y.o. female with medical history significant of COPD, HLD, GERD, cigar smoking, presented with worsening of shortness of breath for about 3 weeks.  She was also having bilateral ankle edema. Patient has a recent CT chest at PCP office which shows a right sided lung mass suspicious for lung cancer, she was referred to see a pulmonary and a schedule appointment on January 7.  On presentation vital stable, saturating 93% on 2 L of oxygen.  Labs with mild hypokalemia at 3.1.  proBNP mildly elevated at 406.  Chest x-ray concerning for right upper lobe mass.  Patient was admitted for concern of COPD exacerbation.  And started on Solu-Medrol  with DuoNeb.  12/26: Vital stable with 100% saturation on 2 L of oxygen, patient developed worsening tremors with Solu-Medrol  so it was switched with prednisone .  Respiratory viral panel negative.  12/27: Hemodynamically stable, intermittently requiring 2 L of oxygen-patient did qualify for home oxygen and it was ordered.  PT recommendations are for SNF but patient wants to go home with home health which was ordered. She was also given a rollator to help with ambulation.  Left groin  pain seems better today.  Likely having mild arthritis exacerbation as CT of left hip was negative for any significant abnormalities except mild osteoarthritis.  There was no more wheezing today and patient seems comfortable.  She is being discharged on few more doses of Zithromax  and prednisone .  We discussed her case with pulmonology and they will be seeing her as outpatient within a week to schedule a bronchoscopy and further assistance with this concerning lung mass.  Patient agreeable to stop smoking and provided with nicotine  patch to help.  Patient will continue on current medications and need to have a close follow-up with her providers for further assistance.  Assessment and Plan: * COPD exacerbation (HCC) Wheezing improved today.  Still feeling little short of breath. Developed significant tremors with Solu-Medrol  which is being switched with prednisone . - Continue with Zithromax  -Continue with bronchodilator and supportive care  Acute hypoxic respiratory failure (HCC) Likely secondary to COPD exacerbation, currently saturating in mid 90s on 2 L of oxygen with no baseline use. -Continue supplemental oxygen-Home oxygen ordered  Mass of right lung Recent diagnosis of right upper lobe mass, PCP apparently referred to pulmonary, concerning for malignancy.  Patient with lifelong history of smoking.  Family requesting inpatient consult. - Pulmonary was consulted-they will likely see her next week as outpatient - Follow-up pulmonary recommendations  Chronic groin pain, left Patient with significant left groin pain going on for a while, refusing to ambulate due to that.  PCP recently did CT of left hip which was done on 12/18 -it was negative for any significant abnormality and did  show mild osteoarthritis. PT recommended SNF but patient wants to go home with home health which was ordered. -Continue with pain management  Essential hypertension Blood pressure currently within  goal. -Continue HCTZ and lisinopril   Hypokalemia Improved with repletion.  Magnesium was normal. - Continue to monitor-replete as needed  Continuous dependence on cigarette smoking Counseling was provided. -Nicotine  patch as needed   Consultants: Pulmonary Procedures performed: None Disposition: Home health Diet recommendation:  Regular diet DISCHARGE MEDICATION: Allergies as of 10/15/2024   No Known Allergies      Medication List     STOP taking these medications    meloxicam 15 MG tablet Commonly known as: MOBIC       TAKE these medications    albuterol  108 (90 Base) MCG/ACT inhaler Commonly known as: VENTOLIN  HFA Inhale 2 puffs into the lungs every 6 (six) hours as needed for wheezing.   amLODipine  2.5 MG tablet Commonly known as: NORVASC  Take 2.5 mg by mouth daily.   aspirin  EC 81 MG tablet Take 1 tablet by mouth daily.   azithromycin  250 MG tablet Commonly known as: ZITHROMAX  Take 1 tablet (250 mg total) by mouth daily for 3 days.   CRANBERRY EXTRACT PO Take 1 tablet by mouth daily.   doxepin  50 MG capsule Commonly known as: SINEQUAN  Take 50-100 mg by mouth at bedtime.   etodolac 400 MG tablet Commonly known as: LODINE Take 400 mg by mouth 2 (two) times daily.   fluticasone -salmeterol 250-50 MCG/ACT Aepb Commonly known as: ADVAIR Inhale 1 puff into the lungs every 12 (twelve) hours.   hydrochlorothiazide  12.5 MG tablet Commonly known as: HYDRODIURIL  Take 1 tablet (12.5 mg total) by mouth daily. Start taking on: October 16, 2024   lisinopril  10 MG tablet Commonly known as: ZESTRIL  Take 1 tablet (10 mg total) by mouth daily. Start taking on: October 16, 2024   nicotine  21 mg/24hr patch Commonly known as: NICODERM CQ  - dosed in mg/24 hours Place 1 patch (21 mg total) onto the skin daily. Start taking on: October 16, 2024   pantoprazole  40 MG tablet Commonly known as: PROTONIX  Take 1 tablet by mouth 2 (two) times daily as  needed.   pravastatin  40 MG tablet Commonly known as: PRAVACHOL  Take 40 mg by mouth at bedtime.   predniSONE  20 MG tablet Commonly known as: DELTASONE  Take 2 tablets (40 mg total) by mouth daily with breakfast for 4 days. Start taking on: October 16, 2024   zolpidem  10 MG tablet Commonly known as: AMBIEN  Take 1 tablet by mouth at bedtime.               Durable Medical Equipment  (From admission, onward)           Start     Ordered   10/15/24 1043  For home use only DME 4 wheeled rolling walker with seat  Once       Question:  Patient needs a walker to treat with the following condition  Answer:  Generalized weakness   10/15/24 1042   10/15/24 1041  For home use only DME oxygen  Once       Question Answer Comment  Length of Need Lifetime   Mode or (Route) Nasal cannula   Liters per Minute 2   Frequency Continuous (stationary and portable oxygen unit needed)   Oxygen conserving device Yes   Oxygen delivery system: Gas   Oxygen delivery system: Concentrator   Oxygen delivery system: Portable concentrator (POC)  10/15/24 1041            Discharge Exam: Filed Weights   10/13/24 1109  Weight: 72.6 kg   General.  Frail elderly lady, in no acute distress. Pulmonary.  Lungs clear bilaterally, normal respiratory effort. CV.  Regular rate and rhythm, no JVD, rub or murmur. Abdomen.  Soft, nontender, nondistended, BS positive. CNS.  Alert and oriented .  No focal neurologic deficit. Extremities.  No edema, no cyanosis, pulses intact and symmetrical. Psychiatry.  Judgment and insight appears normal.   Condition at discharge: stable  The results of significant diagnostics from this hospitalization (including imaging, microbiology, ancillary and laboratory) are listed below for reference.   Imaging Studies: CT HIP LEFT WO CONTRAST Result Date: 10/14/2024 CLINICAL DATA:  Left hip pain. EXAM: CT OF THE LEFT HIP WITHOUT CONTRAST TECHNIQUE: Multidetector CT  imaging of the left hip was performed according to the standard protocol. Multiplanar CT image reconstructions were also generated. RADIATION DOSE REDUCTION: This exam was performed according to the departmental dose-optimization program which includes automated exposure control, adjustment of the mA and/or kV according to patient size and/or use of iterative reconstruction technique. COMPARISON:  None Available. FINDINGS: Bones/Joint/Cartilage No fracture or dislocation. No suspicious osseous lesion. Normal alignment. No sizable joint effusion. Mild osteoarthritis of the left hip with joint space narrowing and osteophytosis. The left sacroiliac joint is anatomically aligned with mild degenerative changes. Ligaments Ligaments are suboptimally evaluated by CT. Muscles and Tendons Mild-to-moderate fatty atrophy of the left gluteus minimus muscle. Soft tissue No loculated fluid collection. Sigmoid colonic diverticulosis. Mildly prominent nonspecific left inguinal lymph node measuring up to 10 mm in short axis (series 5, image 77). Aortoiliac atherosclerotic calcification. IMPRESSION: 1. No acute osseous abnormality. 2. Mild osteoarthritis of the left hip. 3. Mild-to-moderate fatty atrophy of the left gluteus minimus muscle. 4. Sigmoid colonic diverticulosis. 5. Mildly prominent nonspecific left inguinal lymph node may be reactive. Electronically Signed   By: Harrietta Sherry M.D.   On: 10/14/2024 15:42   ECHOCARDIOGRAM COMPLETE Result Date: 10/14/2024    ECHOCARDIOGRAM REPORT   Patient Name:   Kathryn Houston Date of Exam: 10/14/2024 Medical Rec #:  [id number]            Height:       61.0 in Accession #:    [id number]           Weight:       160.0 lb Date of Birth:  January 05, 1941           BSA:          1.718 m Patient Age:    83 years             BP:           132/8 mmHg Patient Gender: F                    HR:           87 bpm. Exam Location:  [facility] Procedure: 2D Echo, Cardiac Doppler, Color Doppler, 3D Echo  and Strain Analysis            (Both Spectral and Color Flow Doppler were utilized during            procedure).                                 MODIFIED REPORT: This report was modified  by Cara JONETTA Lovelace MD on 10/14/2024 due to [place]                      intrep suggestive of possible amyloid.  Indications:     Dyspnea R06.00  History:         Patient has no prior history of Echocardiogram examinations.                  Risk Factors:Dyslipidemia.  Sonographer:     Christopher Furnace Referring Phys:  [id number] CORT ONEIDA MANA Diagnosing Phys: Cara JONETTA Lovelace MD  Sonographer Comments: Global longitudinal strain was attempted. IMPRESSIONS  1. [place] suggest possible Amyloid.  2. Left ventricular ejection fraction, by estimation, is 55 to 60%. The left ventricle has normal function. The left ventricle has no regional wall motion abnormalities. There is mild concentric left ventricular hypertrophy. Left ventricular diastolic parameters are consistent with Grade I diastolic dysfunction (impaired relaxation). The average left ventricular global longitudinal strain is 6.3 %. The global longitudinal strain is abnormal.  3. Right ventricular systolic function is normal. The right ventricular size is normal. Mildly increased right ventricular wall thickness.  4. The mitral valve is normal in structure. No evidence of mitral valve regurgitation.  5. The aortic valve is normal in structure. Aortic valve regurgitation is not visualized. Aortic valve sclerosis is present, with no evidence of aortic valve stenosis. FINDINGS  Left Ventricle: Left ventricular ejection fraction, by estimation, is 55 to 60%. The left ventricle has normal function. The left ventricle has no regional wall motion abnormalities. The average left ventricular global longitudinal strain is 6.3 %. Strain was performed and the global longitudinal strain is abnormal. The left ventricular internal cavity size was normal in size. There is mild concentric left  ventricular hypertrophy. Left ventricular diastolic parameters are consistent with Grade I diastolic dysfunction (impaired relaxation). Right Ventricle: The right ventricular size is normal. Mildly increased right ventricular wall thickness. Right ventricular systolic function is normal. Left Atrium: Left atrial size was normal in size. Right Atrium: Right atrial size was normal in size. Pericardium: There is no evidence of pericardial effusion. Mitral Valve: The mitral valve is normal in structure. No evidence of mitral valve regurgitation. MV peak gradient, 8.5 mmHg. The mean mitral valve gradient is 4.0 mmHg. Tricuspid Valve: The tricuspid valve is normal in structure. Tricuspid valve regurgitation is trivial. Aortic Valve: The aortic valve is normal in structure. Aortic valve regurgitation is not visualized. Aortic valve sclerosis is present, with no evidence of aortic valve stenosis. Aortic valve mean gradient measures 4.5 mmHg. Aortic valve peak gradient measures 7.0 mmHg. Aortic valve area, by VTI measures 2.66 cm. Pulmonic Valve: The pulmonic valve was not well visualized. Pulmonic valve regurgitation is not visualized. Aorta: The ascending aorta was not well visualized. IAS/Shunts: No atrial level shunt detected by color flow Doppler. Additional Comments: [place] suggest possible Amyloid. 3D was performed not requiring image post processing on an independent workstation and was normal.  LEFT VENTRICLE PLAX 2D LVIDd:         4.20 cm   Diastology LVIDs:         3.00 cm   LV e' medial:    7.40 cm/s LV PW:         1.10 cm   LV E/e' medial:  12.6 LV IVS:        1.10 cm   LV e' lateral:   7.18 cm/s LVOT diam:  2.00 cm   LV E/e' lateral: 13.0 LV SV:         68 LV SV Index:   40        2D Longitudinal Strain LVOT Area:     3.14 cm  2D Strain GLS (A4C):   7.1 % LV IVRT:       74 msec   2D Strain GLS (A3C):   4.1 %                          2D Strain GLS (A2C):   7.6 %                          2D Strain GLS  Avg:     6.3 % RIGHT VENTRICLE RV Basal diam:  2.40 cm  PULMONARY VEINS RV Mid diam:    1.90 cm  Diastolic Velocity: 36.90 cm/s                          S/D Velocity:       1.80                          Systolic Velocity:  68.00 cm/s LEFT ATRIUM             Index        RIGHT ATRIUM           Index LA diam:        2.80 cm 1.63 cm/m   RA Area:     12.80 cm LA Vol (A2C):   24.2 ml 14.09 ml/m  RA Volume:   27.20 ml  15.83 ml/m LA Vol (A4C):   41.6 ml 24.22 ml/m LA Biplane Vol: 33.4 ml 19.44 ml/m  AORTIC VALVE AV Area (Vmax):    2.95 cm AV Area (Vmean):   2.60 cm AV Area (VTI):     2.66 cm AV Vmax:           132.00 cm/s AV Vmean:          97.100 cm/s AV VTI:            0.258 m AV Peak Grad:      7.0 mmHg AV Mean Grad:      4.5 mmHg LVOT Vmax:         124.00 cm/s LVOT Vmean:        80.300 cm/s LVOT VTI:          0.218 m LVOT/AV VTI ratio: 0.85  AORTA Ao Root diam: 2.90 cm MITRAL VALVE MV Area (PHT): 4.71 cm     SHUNTS MV Area VTI:   2.37 cm     Systemic VTI:  0.22 m MV Peak grad:  8.5 mmHg     Systemic Diam: 2.00 cm MV Mean grad:  4.0 mmHg MV Vmax:       1.46 m/s MV Vmean:      92.3 cm/s MV Decel Time: 161 msec MV E velocity: 93.30 cm/s MV A velocity: 137.00 cm/s MV E/A ratio:  0.68 Dwayne D Callwood MD Electronically signed by Cara JONETTA Lovelace MD Signature Date/Time: 10/14/2024/12:51:00 PM    Final (Updated)    CT Angio Chest Pulmonary Embolism (PE) W or WO Contrast Result Date: 10/13/2024 CLINICAL DATA:  Shortness of breath EXAM: CT ANGIOGRAPHY CHEST WITH CONTRAST TECHNIQUE: Multidetector CT imaging of the chest  was performed using the standard protocol during bolus administration of intravenous contrast. Multiplanar CT image reconstructions and MIPs were obtained to evaluate the vascular anatomy. RADIATION DOSE REDUCTION: This exam was performed according to the departmental dose-optimization program which includes automated exposure control, adjustment of the mA and/or kV according to patient size  and/or use of iterative reconstruction technique. CONTRAST:  75mL OMNIPAQUE  IOHEXOL  350 MG/ML SOLN COMPARISON:  Chest x-ray 10/13/2024, chest CT 10/06/2024 FINDINGS: Cardiovascular: Satisfactory opacification of the pulmonary arteries to the segmental level. No evidence of pulmonary embolism. Moderate aortic atherosclerosis. No aneurysm. No dissection. Mild cardiomegaly. No sizable pericardial effusion Mediastinum/Nodes: Patent trachea. No thyroid  mass. Enlarged right paratracheal node measures 2.7 cm. Large right hilar node measuring 2.8 cm on series 4, image 56. Borderline prevascular node measuring 8 mm. Esophagus within normal limits. Lungs/Pleura: Emphysema. Spiculated right upper lobe lung mass measuring 3.6 x 3.5 cm on series 6, image 31. Appears contiguous with right suprahilar soft tissue nodule measuring 2.4 x 2.9 cm on series 6, image 40. Total craniocaudal measurement of 4.8 cm on series 7, image 96. Increased small moderate right pleural effusion. Trace left-sided pleural effusion. Suspect passive atelectasis at the right base. Multiple small pulmonary nodules, most lower lobe measuring up to 5 mm on series 6, image 97. Upper Abdomen: No acute finding.  Small hiatal hernia Musculoskeletal: No acute or suspicious osseous abnormality Review of the MIP images confirms the above findings. IMPRESSION: 1. Negative for acute pulmonary embolus or aortic dissection. 2. Redemonstrated spiculated right upper lobe pulmonary mass concerning for primary bronchogenic neoplasm. This may be contiguous with additional right suprahilar mass as discussed above. Enlarged right paratracheal and hilar nodes suspect for metastatic disease. 3. Increased small to moderate right pleural effusion. Trace left-sided pleural effusion. 4. Multiple small pulmonary nodules measuring up to 5 mm, attention on follow-up imaging 5. Emphysema. 6. Aortic atherosclerosis. Aortic Atherosclerosis (ICD10-I70.0) and Emphysema (ICD10-J43.9).  Electronically Signed   By: Luke Bun M.D.   On: 10/13/2024 18:25   DG Chest 2 View Result Date: 10/13/2024 CLINICAL DATA:  Shortness of breath for 3 days. EXAM: DG CHEST 2V COMPARISON:  Chest x-ray 01/06/2022.  Chest CT 10/06/2024. FINDINGS: The cardio pericardial silhouette is enlarged. Soft tissue fullness noted right suprahilar region. Peripheral interstitial coarsening evident with some patchy airspace disease at the right base. No acute bony abnormality. IMPRESSION: 1. Masslike soft tissue fullness in the right suprahilar region. See report for chest CT from 1 week ago. 2. Patchy airspace disease at the right base. I personally called these results to Dr. Jossie in the emergency department and referenced the chest CT of 10/06/2024. Electronically Signed   By: Camellia Candle M.D.   On: 10/13/2024 12:09   CT CHEST WO CONTRAST Result Date: 10/13/2024 CLINICAL DATA:  Lung density seen on chest x-ray. EXAM: CT CHEST WITHOUT CONTRAST TECHNIQUE: Multidetector CT imaging of the chest was performed following the standard protocol without IV contrast. RADIATION DOSE REDUCTION: This exam was performed according to the departmental dose-optimization program which includes automated exposure control, adjustment of the mA and/or kV according to patient size and/or use of iterative reconstruction technique. COMPARISON:  08/15/2016 FINDINGS: Cardiovascular: The heart size is normal. No substantial pericardial effusion. Mild atherosclerotic calcification is noted in the wall of the thoracic aorta. Mediastinum/Nodes: 2.7 cm short axis right paratracheal lymph node evident. Soft tissue fullness in the right hilum (image 60/2) is compatible with lymphadenopathy. No left hilar lymphadenopathy evident. The esophagus has normal imaging features.  There is no axillary lymphadenopathy. Lungs/Pleura: 3.8 x 3.5 cm right suprahilar mass identified medial right apex (31/4). A second nodular soft tissue lesion is seen in the  upper right hilum measuring 2.6 x 2.2 cm on 41/4, potentially lymphadenopathy. Interlobular septal thickening is evident in this region. Subpleural reticulation noted in both lung bases. 5 mm peripheral nodule identified left lower lobe on 103/4. Additional tiny nodules are seen in the left base on image 106/4. 5 mm posterior right lower lobe nodule visible on 71/4. Additional scattered tiny pulmonary nodules evident. Small right pleural effusion. Upper Abdomen: Nodular thickening noted left adrenal gland. Musculoskeletal: No worrisome lytic or sclerotic osseous abnormality. IMPRESSION: 1. 3.8 x 3.5 cm right suprahilar mass in the medial right apex with right paratracheal and right hilar lymphadenopathy. 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 2. Interlobular septal thickening in the right upper lobe may be related to lymphangitic spread of tumor. 3. Small right pleural effusion. 4. Nodular thickening of the left adrenal gland. Metastatic disease not excluded. 5. Scattered tiny bilateral pulmonary nodules measuring up to 5 mm. Attention on follow-up recommended. 6.  Aortic Atherosclerosis (ICD10-I70.0). Electronically Signed   By: Camellia Candle M.D.   On: 10/13/2024 12:05   US  Venous Img Lower Unilateral Left (DVT) Result Date: 10/10/2024 CLINICAL DATA:  Left lower extremity pain and edema. EXAM: LEFT LOWER EXTREMITY VENOUS DOPPLER ULTRASOUND TECHNIQUE: Gray-scale sonography with graded compression, as well as color Doppler and duplex ultrasound were performed to evaluate the lower extremity deep venous systems from the level of the common femoral vein and including the common femoral, femoral, profunda femoral, popliteal and calf veins including the posterior tibial, peroneal and gastrocnemius veins when visible. The superficial great saphenous vein was also interrogated. Spectral Doppler was utilized to evaluate flow at rest and with distal  augmentation maneuvers in the common femoral, femoral and popliteal veins. COMPARISON:  None Available. FINDINGS: Contralateral Common Femoral Vein: Respiratory phasicity is normal and symmetric with the symptomatic side. No evidence of thrombus. Normal compressibility. Common Femoral Vein: No evidence of thrombus. Normal compressibility, respiratory phasicity and response to augmentation. Saphenofemoral Junction: No evidence of thrombus. Normal compressibility and flow on color Doppler imaging. Profunda Femoral Vein: No evidence of thrombus. Normal compressibility and flow on color Doppler imaging. Femoral Vein: No evidence of thrombus. Normal compressibility, respiratory phasicity and response to augmentation. Popliteal Vein: No evidence of thrombus. Normal compressibility, respiratory phasicity and response to augmentation. Calf Veins: No evidence of thrombus. Normal compressibility and flow on color Doppler imaging. Superficial Great Saphenous Vein: No evidence of thrombus. Normal compressibility. Venous Reflux:  None. Other Findings: No evidence of superficial thrombophlebitis or abnormal fluid collection. IMPRESSION: No evidence of left lower extremity deep venous thrombosis. Electronically Signed   By: Marcey Moan M.D.   On: 10/10/2024 14:15    Microbiology: Results for orders placed or performed during the hospital encounter of 10/13/24  Respiratory (~20 pathogens) panel by PCR     Status: None   Collection Time: 10/13/24  4:00 PM   Specimen: Nasopharyngeal Swab; Respiratory  Result Value Ref Range Status   Adenovirus NOT DETECTED NOT DETECTED Final   Coronavirus 229E NOT DETECTED NOT DETECTED Final    Comment: (NOTE) The Coronavirus on the Respiratory Panel, DOES NOT test for the novel  Coronavirus (2019 nCoV)    Coronavirus HKU1 NOT DETECTED NOT DETECTED Final   Coronavirus NL63 NOT DETECTED NOT DETECTED Final   Coronavirus OC43 NOT DETECTED  NOT DETECTED Final   Metapneumovirus NOT  DETECTED NOT DETECTED Final   Rhinovirus / Enterovirus NOT DETECTED NOT DETECTED Final   Influenza A NOT DETECTED NOT DETECTED Final   Influenza B NOT DETECTED NOT DETECTED Final   Parainfluenza Virus 1 NOT DETECTED NOT DETECTED Final   Parainfluenza Virus 2 NOT DETECTED NOT DETECTED Final   Parainfluenza Virus 3 NOT DETECTED NOT DETECTED Final   Parainfluenza Virus 4 NOT DETECTED NOT DETECTED Final   Respiratory Syncytial Virus NOT DETECTED NOT DETECTED Final   Bordetella pertussis NOT DETECTED NOT DETECTED Final   Bordetella Parapertussis NOT DETECTED NOT DETECTED Final   Chlamydophila pneumoniae NOT DETECTED NOT DETECTED Final   Mycoplasma pneumoniae NOT DETECTED NOT DETECTED Final    Comment: Performed at [facility], [address], [place], [place] [address]    Labs: CBC: Recent Labs  Lab 10/13/24 1112  WBC 8.5  HGB 11.7*  HCT 39.9  MCV 80.9  PLT 377   Basic Metabolic Panel: Recent Labs  Lab 10/13/24 1112 10/14/24 0954  NA 146* 145  K 3.1* 3.6  CL 108 107  CO2 27 28  GLUCOSE 103* 136*  BUN 10 14  CREATININE 0.85 0.97  CALCIUM 9.0 9.0  MG  --  2.2   Liver Function Tests: No results for input(s): AST, ALT, ALKPHOS, BILITOT, PROT, ALBUMIN in the last 168 hours. CBG: No results for input(s): GLUCAP in the last 168 hours.  Discharge time spent: greater than 30 minutes.  This record has been created using Conservation officer, historic buildings. Errors have been sought and corrected,but may not always be located. Such creation errors do not reflect on the standard of care.   Signed: Amaryllis Dare, MD [facility] 10/15/2024 "

## 2024-10-15 NOTE — Plan of Care (Signed)
  Problem: Education: Goal: Knowledge of disease or condition will improve Outcome: Adequate for Discharge Goal: Knowledge of the prescribed therapeutic regimen will improve Outcome: Adequate for Discharge Goal: Individualized Educational Video(s) Outcome: Adequate for Discharge   Problem: Activity: Goal: Ability to tolerate increased activity will improve Outcome: Adequate for Discharge Goal: Will verbalize the importance of balancing activity with adequate rest periods Outcome: Adequate for Discharge   Problem: Respiratory: Goal: Ability to maintain a clear airway will improve Outcome: Adequate for Discharge Goal: Levels of oxygenation will improve Outcome: Adequate for Discharge Goal: Ability to maintain adequate ventilation will improve Outcome: Adequate for Discharge   Problem: Education: Goal: Knowledge of General Education information will improve Description: Including pain rating scale, medication(s)/side effects and non-pharmacologic comfort measures Outcome: Adequate for Discharge   Problem: Health Behavior/Discharge Planning: Goal: Ability to manage health-related needs will improve Outcome: Adequate for Discharge   Problem: Clinical Measurements: Goal: Ability to maintain clinical measurements within normal limits will improve Outcome: Adequate for Discharge Goal: Will remain free from infection Outcome: Adequate for Discharge Goal: Diagnostic test results will improve Outcome: Adequate for Discharge Goal: Respiratory complications will improve Outcome: Adequate for Discharge Goal: Cardiovascular complication will be avoided Outcome: Adequate for Discharge   Problem: Activity: Goal: Risk for activity intolerance will decrease Outcome: Adequate for Discharge   Problem: Nutrition: Goal: Adequate nutrition will be maintained Outcome: Adequate for Discharge   Problem: Coping: Goal: Level of anxiety will decrease Outcome: Adequate for Discharge    Problem: Elimination: Goal: Will not experience complications related to bowel motility Outcome: Adequate for Discharge Goal: Will not experience complications related to urinary retention Outcome: Adequate for Discharge   Problem: Pain Managment: Goal: General experience of comfort will improve and/or be controlled Outcome: Adequate for Discharge   Problem: Safety: Goal: Ability to remain free from injury will improve Outcome: Adequate for Discharge   Problem: Skin Integrity: Goal: Risk for impaired skin integrity will decrease Outcome: Adequate for Discharge

## 2024-10-15 NOTE — Progress Notes (Signed)
 All discharge requirements met.

## 2024-10-17 ENCOUNTER — Other Ambulatory Visit: Payer: Self-pay | Admitting: Pulmonary Disease

## 2024-10-24 ENCOUNTER — Other Ambulatory Visit: Payer: Self-pay | Admitting: Pulmonary Disease

## 2024-10-24 ENCOUNTER — Other Ambulatory Visit: Payer: Self-pay | Admitting: Orthopedic Surgery

## 2024-10-24 DIAGNOSIS — J432 Centrilobular emphysema: Secondary | ICD-10-CM

## 2024-10-24 DIAGNOSIS — M25552 Pain in left hip: Secondary | ICD-10-CM

## 2024-10-24 DIAGNOSIS — R918 Other nonspecific abnormal finding of lung field: Secondary | ICD-10-CM

## 2024-10-25 ENCOUNTER — Ambulatory Visit

## 2024-10-25 ENCOUNTER — Ambulatory Visit
Admission: RE | Admit: 2024-10-25 | Discharge: 2024-10-25 | Disposition: A | Source: Ambulatory Visit | Attending: Pulmonary Disease

## 2024-10-25 ENCOUNTER — Ambulatory Visit: Admission: RE | Admit: 2024-10-25 | Source: Ambulatory Visit

## 2024-10-25 ENCOUNTER — Other Ambulatory Visit: Payer: Self-pay

## 2024-10-25 ENCOUNTER — Ambulatory Visit
Admission: RE | Admit: 2024-10-25 | Discharge: 2024-10-25 | Disposition: A | Discharge status: Home / Self Care | Source: Ambulatory Visit | Attending: Pulmonary Disease | Admitting: Pulmonary Disease

## 2024-10-25 VITALS — Ht 61.0 in | Wt 159.8 lb

## 2024-10-25 DIAGNOSIS — J432 Centrilobular emphysema: Secondary | ICD-10-CM

## 2024-10-25 DIAGNOSIS — R918 Other nonspecific abnormal finding of lung field: Secondary | ICD-10-CM | POA: Insufficient documentation

## 2024-10-25 DIAGNOSIS — M25552 Pain in left hip: Secondary | ICD-10-CM | POA: Diagnosis present

## 2024-10-25 DIAGNOSIS — Z01818 Encounter for other preprocedural examination: Secondary | ICD-10-CM | POA: Insufficient documentation

## 2024-10-25 DIAGNOSIS — R911 Solitary pulmonary nodule: Secondary | ICD-10-CM

## 2024-10-25 HISTORY — DX: Cerebral aneurysm, nonruptured: I67.1

## 2024-10-25 HISTORY — DX: Secondary polycythemia: D75.1

## 2024-10-25 HISTORY — DX: Insomnia, unspecified: G47.00

## 2024-10-25 NOTE — Patient Instructions (Addendum)
 Your procedure is scheduled on: FRIDAY 10/28/24 Report to the Registration Desk on the 1st floor of [facility]. To find out your arrival time, please call [phone] between 1PM - 3PM on: THURSDAY 10/27/24 If your arrival time is 6:00 am, do not arrive before that time as the [facility] entrance doors do not open until 6:00 am.  REMEMBER: Instructions that are not followed completely may result in serious medical risk, up to and including death; or upon the discretion of your surgeon and anesthesiologist your surgery may need to be rescheduled.  Do not eat food after midnight the night before surgery.  No gum chewing or hard candies.  You may however, drink CLEAR liquids up to 2 hours before you are scheduled to arrive for your surgery. Do not drink anything within 2 hours of your scheduled arrival time.  Clear liquids include: - water  - apple juice without pulp - gatorade (not RED colors) - black coffee or tea (Do NOT add milk or creamers to the coffee or tea) Do NOT drink anything that is not on this list.  One week prior to surgery: Stop Anti-inflammatories (NSAIDS) such as etodolac (LODINE) Advil, Aleve, Ibuprofen, Motrin, Naproxen, Naprosyn and Aspirin  based products such as Excedrin, Goody's Powder, BC Powder. Stop ANY OVER THE COUNTER supplements until after surgery.   STOP aspirin  until after the procedure.   You may however, continue to take Tylenol  if needed for pain up until the day of surgery.  Continue taking all of your other prescription medications up until the day of surgery.  ON THE DAY OF SURGERY ONLY TAKE THESE MEDICATIONS WITH SIPS OF WATER:  albuterol  (VENTOLIN  HFA)  amLODipine  (NORVASC )  fluticasone -salmeterol (ADVAIR)  pantoprazole  (PROTONIX )   Use inhalers on the day of surgery and bring to the hospital.  No Alcohol  for 24 hours before or after surgery.  No Smoking including e-cigarettes for 24 hours before surgery.  No chewable tobacco  products for at least 6 hours before surgery.  No nicotine  patches on the day of surgery.  Do not use any recreational drugs for at least a week (preferably 2 weeks) before your surgery.  Please be advised that the combination of cocaine and anesthesia may have negative outcomes, up to and including death. If you test positive for cocaine, your surgery will be cancelled.  On the morning of surgery brush your teeth with toothpaste and water, you may rinse your mouth with mouthwash if you wish. Do not swallow any toothpaste or mouthwash.  Use CHG Soap or wipes as directed on instruction sheet.  Do not wear jewelry, make-up, hairpins, clips or nail polish.  For welded (permanent) jewelry: bracelets, anklets, waist bands, etc.  Please have this removed prior to surgery.  If it is not removed, there is a chance that hospital personnel will need to cut it off on the day of surgery.  Do not wear lotions, powders, or perfumes.   Do not shave body hair from the neck down 48 hours before surgery.  Contact lenses, hearing aids and dentures may not be worn into surgery.  Do not bring valuables to the hospital. [facility] is not responsible for any missing/lost belongings or valuables.   Notify your doctor if there is any change in your medical condition (cold, fever, infection).  Wear comfortable clothing (specific to your surgery type) to the hospital.  After surgery, you can help prevent lung complications by doing breathing exercises.  Take deep breaths and cough  every 1-2 hours. Your doctor may order a device called an Incentive Spirometer to help you take deep breaths.  If you are being discharged the day of surgery, you will not be allowed to drive home. You will need a responsible individual to drive you home and stay with you for 24 hours after surgery.   If you are taking public transportation, you will need to have a responsible individual with you.  Please call the  Pre-admissions Testing Dept. at [phone] if you have any questions about these instructions.  Surgery Visitation Policy:  Patients having surgery or a procedure may have two visitors.  Children under the age of 3 must have an adult with them who is not the patient.  Merchandiser, Retail to address health-related social needs:  [email or web address]

## 2024-10-26 NOTE — Progress Notes (Signed)
 Cardiac Clearance from 10-19-24 from Dr Antonieta risk

## 2024-10-27 MED ORDER — PHENYLEPHRINE HCL-NACL 20-0.9 MG/250ML-% IV SOLN
INTRAVENOUS | Status: AC
  Filled 2024-10-27: qty 250

## 2024-10-28 ENCOUNTER — Ambulatory Visit

## 2024-10-28 ENCOUNTER — Encounter
Admission: RE | Disposition: A | Discharge status: Home / Self Care | Payer: Self-pay | Source: Home / Self Care | Attending: Pulmonary Disease

## 2024-10-28 ENCOUNTER — Ambulatory Visit
Admission: RE | Admit: 2024-10-28 | Discharge: 2024-10-28 | Disposition: A | Discharge status: Home / Self Care | Attending: Pulmonary Disease | Admitting: Pulmonary Disease

## 2024-10-28 ENCOUNTER — Encounter: Payer: Self-pay | Admitting: Pulmonary Disease

## 2024-10-28 ENCOUNTER — Other Ambulatory Visit: Payer: Self-pay

## 2024-10-28 DIAGNOSIS — E785 Hyperlipidemia, unspecified: Secondary | ICD-10-CM | POA: Insufficient documentation

## 2024-10-28 DIAGNOSIS — C771 Secondary and unspecified malignant neoplasm of intrathoracic lymph nodes: Secondary | ICD-10-CM | POA: Diagnosis not present

## 2024-10-28 DIAGNOSIS — D751 Secondary polycythemia: Secondary | ICD-10-CM | POA: Diagnosis not present

## 2024-10-28 DIAGNOSIS — I509 Heart failure, unspecified: Secondary | ICD-10-CM | POA: Diagnosis not present

## 2024-10-28 DIAGNOSIS — K219 Gastro-esophageal reflux disease without esophagitis: Secondary | ICD-10-CM | POA: Insufficient documentation

## 2024-10-28 DIAGNOSIS — I671 Cerebral aneurysm, nonruptured: Secondary | ICD-10-CM | POA: Insufficient documentation

## 2024-10-28 DIAGNOSIS — T17590A Other foreign object in bronchus causing asphyxiation, initial encounter: Secondary | ICD-10-CM | POA: Insufficient documentation

## 2024-10-28 DIAGNOSIS — X58XXXA Exposure to other specified factors, initial encounter: Secondary | ICD-10-CM | POA: Insufficient documentation

## 2024-10-28 DIAGNOSIS — G4700 Insomnia, unspecified: Secondary | ICD-10-CM | POA: Diagnosis not present

## 2024-10-28 DIAGNOSIS — F17210 Nicotine dependence, cigarettes, uncomplicated: Secondary | ICD-10-CM | POA: Insufficient documentation

## 2024-10-28 DIAGNOSIS — R590 Localized enlarged lymph nodes: Secondary | ICD-10-CM | POA: Diagnosis present

## 2024-10-28 DIAGNOSIS — R918 Other nonspecific abnormal finding of lung field: Secondary | ICD-10-CM | POA: Diagnosis present

## 2024-10-28 DIAGNOSIS — Z9981 Dependence on supplemental oxygen: Secondary | ICD-10-CM | POA: Diagnosis not present

## 2024-10-28 DIAGNOSIS — J439 Emphysema, unspecified: Secondary | ICD-10-CM | POA: Diagnosis not present

## 2024-10-28 DIAGNOSIS — I110 Hypertensive heart disease with heart failure: Secondary | ICD-10-CM | POA: Diagnosis not present

## 2024-10-28 DIAGNOSIS — J90 Pleural effusion, not elsewhere classified: Secondary | ICD-10-CM | POA: Diagnosis not present

## 2024-10-28 DIAGNOSIS — C3411 Malignant neoplasm of upper lobe, right bronchus or lung: Secondary | ICD-10-CM | POA: Insufficient documentation

## 2024-10-28 DIAGNOSIS — I700 Atherosclerosis of aorta: Secondary | ICD-10-CM | POA: Diagnosis not present

## 2024-10-28 HISTORY — PX: VIDEO BRONCHOSCOPY WITH ENDOBRONCHIAL ULTRASOUND: SHX6177

## 2024-10-28 HISTORY — PX: VIDEO BRONCHOSCOPY: SHX5072

## 2024-10-28 SURGERY — BRONCHOSCOPY, VIDEO-ASSISTED
Anesthesia: General

## 2024-10-28 MED ORDER — PHENYLEPHRINE 80 MCG/ML (10ML) SYRINGE FOR IV PUSH (FOR BLOOD PRESSURE SUPPORT)
PREFILLED_SYRINGE | INTRAVENOUS | Status: AC
  Filled 2024-10-28: qty 10

## 2024-10-28 MED ORDER — PHENYLEPHRINE 80 MCG/ML (10ML) SYRINGE FOR IV PUSH (FOR BLOOD PRESSURE SUPPORT)
PREFILLED_SYRINGE | INTRAVENOUS | Status: DC | PRN
  Administered 2024-10-28: 120 ug via INTRAVENOUS
  Administered 2024-10-28 (×4): 160 ug via INTRAVENOUS

## 2024-10-28 MED ORDER — ROCURONIUM BROMIDE 100 MG/10ML IV SOLN
INTRAVENOUS | Status: DC | PRN
  Administered 2024-10-28: 50 mg via INTRAVENOUS
  Administered 2024-10-28: 20 mg via INTRAVENOUS

## 2024-10-28 MED ORDER — LACTATED RINGERS IV SOLN: INTRAVENOUS | Status: DC

## 2024-10-28 MED ORDER — ORAL CARE MOUTH RINSE: 15.0000 mL | Freq: Once | OROMUCOSAL | Status: AC

## 2024-10-28 MED ORDER — CHLORHEXIDINE GLUCONATE 0.12 % MT SOLN
15.0000 mL | Freq: Once | OROMUCOSAL | Status: AC
  Administered 2024-10-28: 15 mL via OROMUCOSAL

## 2024-10-28 MED ORDER — OXYCODONE HCL 5 MG PO TABS: 5.0000 mg | ORAL_TABLET | Freq: Once | ORAL | Status: DC | PRN

## 2024-10-28 MED ORDER — CHLORHEXIDINE GLUCONATE 0.12 % MT SOLN
OROMUCOSAL | Status: AC
  Filled 2024-10-28: qty 15

## 2024-10-28 MED ORDER — SUGAMMADEX SODIUM 200 MG/2ML IV SOLN
INTRAVENOUS | Status: DC | PRN
  Administered 2024-10-28: 200 mg via INTRAVENOUS

## 2024-10-28 MED ORDER — FENTANYL CITRATE (PF) 100 MCG/2ML IJ SOLN: 25.0000 ug | INTRAMUSCULAR | Status: DC | PRN

## 2024-10-28 MED ORDER — FENTANYL CITRATE (PF) 100 MCG/2ML IJ SOLN
INTRAMUSCULAR | Status: DC | PRN
  Administered 2024-10-28 (×2): 50 ug via INTRAVENOUS

## 2024-10-28 MED ORDER — DEXAMETHASONE SOD PHOSPHATE PF 10 MG/ML IJ SOLN
INTRAMUSCULAR | Status: DC | PRN
  Administered 2024-10-28: 10 mg via INTRAVENOUS

## 2024-10-28 MED ORDER — LACTATED RINGERS IV SOLN: INTRAVENOUS | Status: DC | PRN

## 2024-10-28 MED ORDER — EPHEDRINE SULFATE-NACL 50-0.9 MG/10ML-% IV SOSY
PREFILLED_SYRINGE | INTRAVENOUS | Status: DC | PRN
  Administered 2024-10-28: 5 mg via INTRAVENOUS

## 2024-10-28 MED ORDER — PROPOFOL 10 MG/ML IV BOLUS
INTRAVENOUS | Status: DC | PRN
  Administered 2024-10-28: 100 mg via INTRAVENOUS
  Administered 2024-10-28: 100 ug/kg/min via INTRAVENOUS

## 2024-10-28 MED ORDER — OXYCODONE HCL 5 MG/5ML PO SOLN: 5.0000 mg | Freq: Once | ORAL | Status: DC | PRN

## 2024-10-28 MED ORDER — LIDOCAINE HCL (CARDIAC) PF 100 MG/5ML IV SOSY
PREFILLED_SYRINGE | INTRAVENOUS | Status: DC | PRN
  Administered 2024-10-28: 100 mg via INTRAVENOUS

## 2024-10-28 MED ORDER — FENTANYL CITRATE (PF) 100 MCG/2ML IJ SOLN
INTRAMUSCULAR | Status: AC
  Filled 2024-10-28: qty 2

## 2024-10-28 NOTE — Anesthesia Preprocedure Evaluation (Signed)
"                                    Anesthesia Evaluation  Patient identified by MRN, date of birth, ID band Patient awake    Reviewed: Allergy & Precautions, NPO status , Patient's Chart, lab work & pertinent test results  Airway Mallampati: III  TM Distance: >3 FB Neck ROM: full    Dental  (+) Chipped   Pulmonary COPD,  COPD inhaler and oxygen dependent, Current Smoker   Pulmonary exam normal        Cardiovascular hypertension, On Medications +CHF  Normal cardiovascular exam     Neuro/Psych negative neurological ROS  negative psych ROS   GI/Hepatic Neg liver ROS,GERD  Medicated and Controlled,,  Endo/Other  negative endocrine ROS    Renal/GU      Musculoskeletal   Abdominal   Peds  Hematology negative hematology ROS (+)   Anesthesia Other Findings Past Medical History: No date: COPD (chronic obstructive pulmonary disease) (HCC) No date: GERD (gastroesophageal reflux disease) No date: Hyperlipidemia No date: Insomnia 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 No date: Polycythemia  Past Surgical History: No date: APPENDECTOMY 2006: BRAIN SURGERY     Comment:  for aneurysm No date: TONSILLECTOMY     Reproductive/Obstetrics negative OB ROS                              Anesthesia Physical Anesthesia Plan  ASA: 4  Anesthesia Plan: General ETT   Post-op Pain Management:    Induction: Intravenous  PONV Risk Score and Plan: Ondansetron , TIVA and Propofol  infusion  Airway Management Planned: Oral ETT  Additional Equipment:   Intra-op Plan:   Post-operative Plan: Extubation in OR  Informed Consent: I have reviewed the patients History and Physical, chart, labs and discussed the procedure including the risks, benefits and alternatives for the proposed anesthesia with the patient or authorized representative who has indicated his/her understanding and acceptance.     Dental Advisory Given  Plan Discussed with:  Anesthesiologist, CRNA and Surgeon  Anesthesia Plan Comments: (Patient consented for risks of anesthesia including but not limited to:  - adverse reactions to medications - damage to eyes, teeth, lips or other oral mucosa - nerve damage due to positioning  - sore throat or hoarseness - Damage to heart, brain, nerves, lungs, other parts of body or loss of life  Patient voiced understanding and assent.)        Anesthesia Quick Evaluation  "

## 2024-10-28 NOTE — Procedures (Signed)
 ROBOTIC NAVIGATIONAL BRONCHOSCOPY PROCEDURE NOTE 31627  FIBEROPTIC BRONCHOSCOPY WITH BRONCHOALVEOLAR LAVAGE PROCEDURE NOTE 31624  THERAPEUTIC ASPIRATION OF TRACHEOBRONCHIAL TREE 31645  ENDOBRONCHIAL ULTRASOUND X >/3  LYMPH NODE PROCEDURE NOTE  31653  TRANSBRONCHIAL FNA X 1 LOBE 31629  TRANSBRONCHIAL SURGICAL LUNG BIOPSY X 1 LOBE 31628  RADIAL ENDOBRONCHIAL [id number]  TRANSBRONCHIAL CYTOPATHOLOGY BRUSHINGS [id number]    Flexible bronchoscopy was performed  by : Parris MD  assistance by : 1)Repiratory therapist  and 2)cytotech staff and 3) Anesthesia team and 4) Flouroscopy team and 5) IT supporting staff for robotic platform   Indication for the procedure was :  Pre-procedural H&P. The following assessment was performed on the day of the procedure prior to initiating sedation History:  Chest pain n Dyspnea y Hemoptysis n Cough y Fever n Other pertinent items n  Examination Vital signs -reviewed as per nursing documentation today Cardiac    Murmurs: n  Rubs : n  Gallop: n Lungs Wheezing: n Rales : n Rhonchi :y  Other pertinent findings: SOB/hypoxemia due to chronic lung disease   Pre-procedural assessment for Procedural Sedation included: Depth of sedation: As per anesthesia team  ASA Classification:  2 Mallampati airway assessment: 3    Medication list reviewed: y  The patients interval history was taken and revealed: no new complaints The pre- procedure physical examination revealed: No new findings Refer to prior clinic note for details.  Informed Consent: Informed consent was obtained from:  patient after explanation of procedure and risks, benefits, as well as alternative procedures available.  Explanation of level of sedation and possible transfusion was also provided.    Procedural Preparation: Time out was performed and patient was identified by name and birthdate and procedure to be performed and side for sampling, if any, was specified. Pt  was intubated by anesthesia.  The patient was appropriately draped.   Fiberoptic bronchoscopy with airway inspection, therapeutic aspiration of tracheobronchial tree and BAL Procedure findings:  Bronchoscope was inserted via ETT  without difficulty.  Posterior oropharynx, epiglottis, arytenoids, false cords and vocal cords were not visualized as these were bypassed by endotracheal tube. The distal trachea was normal in circumference and appearance without mucosal, cartilaginous or branching abnormalities.  The main carina was mildly splayed . All right and left lobar airways were NOT visualized to the Subsegmental level due to obstructed airways from phlegm and mucus  plugging. Mucus plugging with partial to complete airway obstruction was noted bilaterally and treated with therapeutic aspiration prior to beginning of robotic bronchoscopy to allow adequate visualization and accurate lung biopsy and improve patients respiratory status.   The mucosa was : friable at target segment  Airways were notable for:        exophytic lesions :n       extrinsic compression in the following distributions: n.       Friable mucosa: y       Teacher, music /pigmentation: n     Post procedure Diagnosis:   Mucus plugging of tracheobronchial tree      Navigational Bronchoscopy Procedure Findings:   Ion robotic platform utilized for this procedure. Post appropriate planning and registration peripheral navigation was used to visualize target lesion.   Radial endobronchial US  technology utilized during this procedure for confirmation of lesion.  3D Fluoroscopy utilized for tool in lesion confirmation    Target 1 - Transbronchial cytobrushing x  1,   Transbronchial FNA x 4,   Transbronchial surgical pathology x 5,    BAL was performed at this  location and sent for processing with cytology and microbiology  Due to insufficient tissue via FNA a surgical lung biopsy via surgical forceps was required  for adequate pathology tissue evaluation  Post procedure diagnosis:  Right upper lobe lung mass with hilar adenopathy      Endobronchial ultrasound assisted hilar and mediastinal lymph node biopsies procedure findings: The fiberoptic bronchoscope was removed and the EBUS scope was introduced. Examination began to evaluate for pathologically enlarged lymph nodes starting on the left  side progressing to the right side.  All lymph node biopsies performed with 21g needle. Lymph node biopsies were sent in cytolite for all stations.  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   Total dosage of Lidocaine  was ZERO mg  Estimated Blood loss: EXPECTED < 5cc.  Complications included:  NONE   Preliminary CXR findings :  IN PROCESS  Disposition: HOME WITH FAMILY   Follow up with Dr. Alyssa Mancera in 5 days for result discussion.     Halina Picking MD  [facility] Division of Pulmonary & Critical Care Medicine

## 2024-10-28 NOTE — Transfer of Care (Signed)
 Immediate Anesthesia Transfer of Care Note  Patient: Kathryn Houston  Procedure(s) Performed: BRONCHOSCOPY, VIDEO-ASSISTED BRONCHOSCOPY, WITH EBUS  Patient Location: PACU  Anesthesia Type:General  Level of Consciousness: awake, alert , and patient cooperative  Airway & Oxygen Therapy: Patient Spontanous Breathing  Post-op Assessment: Report given to RN, Post -op Vital signs reviewed and stable, and Patient moving all extremities X 4  Post vital signs: Reviewed and stable  Last Vitals:  Vitals Value Taken Time  BP 137/104 10/28/24 10:23  Temp    Pulse 80 10/28/24 10:29  Resp 14 10/28/24 10:29  SpO2 93 % 10/28/24 10:29  Vitals shown include unfiled device data.  Last Pain: There were no vitals filed for this visit.       Complications: There were no known notable events for this encounter.

## 2024-10-28 NOTE — H&P (Signed)
 "    PULMONOLOGY         Date: 10/28/2024,   MRN# [id number] Kathryn Houston 1941-09-23     Admission                  Current   Referring provider: Dr Theotis    CHIEF COMPLAINT:   Right upper lobe lung mass with mucous plugging of tracheobronchial tree   HISTORY OF PRESENT ILLNESS   This is a 84 year old female with a history of insomnia, intracranial aneurysm, polycythemia, dyslipidemia, GERD and COPD with a history of lifelong smoking.  She was seen in clinic and was noted to have abnormal CT chest imaging.  CT chest showed enlarged right paratracheal nodes and large right hilar nodes as well as borderline prevascular lymphadenopathy.  There is a spiculated right upper lobe lung mass measuring approximately 3-1/2 cm in the largest plane.  There is also scattered mucous impaction and may airways bilaterally.  Today patient is here for bronchoscopic airway examination with therapeutic aspiration of tracheobronchial tree for mucous plugging as well as bronchoalveolar lavage for cytology and microbiology, lung biopsy via robotic assisted navigational bronchoscopy and finally endobronchial ultrasound assisted lymph node biopsies for lymph node staging.  She has no new clinical findings and wishes to proceed as previously planned.  Reviewed risks/complications and benefits with patient, risks include infection, pneumothorax/pneumomediastinum which may require chest tube placement as well as overnight/prolonged hospitalization and possible mechanical ventilation. Other risks include bleeding and very rarely death.  Patient understands risks and wishes to proceed.  Additional questions were answered, and patient is aware that post procedure patient will be going home with family and may experience cough with possible clots on expectoration as well as phlegm which may last few days as well as hoarseness of voice post intubation and mechanical ventilation.    PAST MEDICAL HISTORY    Past Medical History:  Diagnosis Date   COPD (chronic obstructive pulmonary disease) (HCC)    GERD (gastroesophageal reflux disease)    Hyperlipidemia    Insomnia    Intracranial aneurysm    Polycythemia      SURGICAL HISTORY   Past Surgical History:  Procedure Laterality Date   APPENDECTOMY     BRAIN SURGERY  2006   for aneurysm   TONSILLECTOMY       FAMILY HISTORY   History reviewed. No pertinent family history.   SOCIAL HISTORY   Social History[1]   MEDICATIONS    Home Medication:    Current Medication: Current Medications[2]    ALLERGIES   Patient has no known allergies.     REVIEW OF SYSTEMS    Review of Systems:  Gen:  Denies  fever, sweats, chills weigh loss  HEENT: Denies blurred vision, double vision, ear pain, eye pain, hearing loss, nose bleeds, sore throat Cardiac:  No dizziness, chest pain or heaviness, chest tightness,edema Resp:   reports dyspnea chronically  Gi: Denies swallowing difficulty, stomach pain, nausea or vomiting, diarrhea, constipation, bowel incontinence Gu:  Denies bladder incontinence, burning urine Ext:   Denies Joint pain, stiffness or swelling Skin: Denies  skin rash, easy bruising or bleeding or hives Endoc:  Denies polyuria, polydipsia , polyphagia or weight change Psych:   Denies depression, insomnia or hallucinations   Other:  All other systems negative   VS: There were no vitals taken for this visit.     PHYSICAL EXAM    GENERAL:NAD, no fevers, chills, no weakness no fatigue HEAD: Normocephalic, atraumatic.  EYES: Pupils equal, round, reactive to light. Extraocular muscles intact. No scleral icterus.  MOUTH: Moist mucosal membrane. Dentition intact. No abscess noted.  EAR, NOSE, THROAT: Clear without exudates. No external lesions.  NECK: Supple. No thyromegaly. No nodules. No JVD.  PULMONARY: decreased breath sounds with mild rhonchi worse at bases bilaterally.  CARDIOVASCULAR: S1 and S2.  Regular rate and rhythm. No murmurs, rubs, or gallops. No edema. Pedal pulses 2+ bilaterally.  GASTROINTESTINAL: Soft, nontender, nondistended. No masses. Positive bowel sounds. No hepatosplenomegaly.  MUSCULOSKELETAL: No swelling, clubbing, or edema. Range of motion full in all extremities.  NEUROLOGIC: Cranial nerves II through XII are intact. No gross focal neurological deficits. Sensation intact. Reflexes intact.  SKIN: No ulceration, lesions, rashes, or cyanosis. Skin warm and dry. Turgor intact.  PSYCHIATRIC: Mood, affect within normal limits. The patient is awake, alert and oriented x 3. Insight, judgment intact.       IMAGING   @IMAGES @ Narrative & Impression CLINICAL DATA:  Shortness of breath   EXAM: CT ANGIOGRAPHY CHEST WITH CONTRAST   TECHNIQUE: Multidetector CT imaging of the chest was performed using the standard protocol during bolus administration of intravenous contrast. Multiplanar CT image reconstructions and MIPs were obtained to evaluate the vascular anatomy.   RADIATION DOSE REDUCTION: This exam was performed according to the departmental dose-optimization program which includes automated exposure control, adjustment of the mA and/or kV according to patient size and/or use of iterative reconstruction technique.   CONTRAST:  75mL OMNIPAQUE  IOHEXOL  350 MG/ML SOLN   COMPARISON:  Chest x-ray 10/13/2024, chest CT 10/06/2024   FINDINGS: Cardiovascular: Satisfactory opacification of the pulmonary arteries to the segmental level. No evidence of pulmonary embolism. Moderate aortic atherosclerosis. No aneurysm. No dissection. Mild cardiomegaly. No sizable pericardial effusion   Mediastinum/Nodes: Patent trachea. No thyroid  mass. Enlarged right paratracheal node measures 2.7 cm. Large right hilar node measuring 2.8 cm on series 4, image 56. Borderline prevascular node measuring 8 mm. Esophagus within normal limits.   Lungs/Pleura: Emphysema. Spiculated right  upper lobe lung mass measuring 3.6 x 3.5 cm on series 6, image 31. Appears contiguous with right suprahilar soft tissue nodule measuring 2.4 x 2.9 cm on series 6, image 40. Total craniocaudal measurement of 4.8 cm on series 7, image 96. Increased small moderate right pleural effusion. Trace left-sided pleural effusion. Suspect passive atelectasis at the right base. Multiple small pulmonary nodules, most lower lobe measuring up to 5 mm on series 6, image 97.   Upper Abdomen: No acute finding.  Small hiatal hernia   Musculoskeletal: No acute or suspicious osseous abnormality   Review of the MIP images confirms the above findings.   IMPRESSION: 1. Negative for acute pulmonary embolus or aortic dissection. 2. Redemonstrated spiculated right upper lobe pulmonary mass concerning for primary bronchogenic neoplasm. This may be contiguous with additional right suprahilar mass as discussed above. Enlarged right paratracheal and hilar nodes suspect for metastatic disease. 3. Increased small to moderate right pleural effusion. Trace left-sided pleural effusion. 4. Multiple small pulmonary nodules measuring up to 5 mm, attention on follow-up imaging 5. Emphysema. 6. Aortic atherosclerosis.   Aortic Atherosclerosis (ICD10-I70.0) and Emphysema (ICD10-J43.9).     Electronically Signed   By: Luke Bun M.D.   On: 10/13/2024 18:25  ASSESSMENT/PLAN   Right upper lobe lung mass with mucus plugging and hilar adenopathy CT chest showed enlarged right paratracheal nodes and large right hilar nodes as well as borderline prevascular lymphadenopathy.  There is a spiculated right upper lobe lung mass  measuring approximately 3-1/2 cm in the largest plane.  There is also scattered mucous impaction and may airways bilaterally.  Today patient is here for bronchoscopic airway examination with therapeutic aspiration of tracheobronchial tree for mucous plugging as well as bronchoalveolar lavage for cytology  and microbiology, lung biopsy via robotic assisted navigational bronchoscopy and finally endobronchial ultrasound assisted lymph node biopsies for lymph node staging.  She has no new clinical findings and wishes to proceed as previously planned.  Reviewed risks/complications and benefits with patient, risks include infection, pneumothorax/pneumomediastinum which may require chest tube placement as well as overnight/prolonged hospitalization and possible mechanical ventilation. Other risks include bleeding and very rarely death.  Patient understands risks and wishes to proceed.  Additional questions were answered, and patient is aware that post procedure patient will be going home with family and may experience cough with possible clots on expectoration as well as phlegm which may last few days as well as hoarseness of voice post intubation and mechanical ventilation.            Thank you for allowing me to participate in the care of this patient.   Patient/Family are satisfied with care plan and all questions have been answered.    Provider disclosure: Patient with at least one acute or chronic illness or injury that poses a threat to life or bodily function and is being managed actively during this encounter.  All of the below services have been performed independently by signing provider:  review of prior documentation from internal and or external health records.  Review of previous and current lab results.  Interview and comprehensive assessment during patient visit today. Review of current and previous chest radiographs/CT scans. Discussion of management and test interpretation with health care team and patient/family.   This document was prepared using Dragon voice recognition software and may include unintentional dictation errors.     Shamicka Inga, M.D.  Division of Pulmonary & Critical Care Medicine             [1]  Social History Tobacco Use   Smoking status: Every  Day    Current packs/day: 1.00    Types: Cigarettes   Smokeless tobacco: Never  Vaping Use   Vaping status: Never Used  Substance Use Topics   Alcohol  use: No   Drug use: Never  [2]  Current Facility-Administered Medications:    lactated ringers  infusion, , Intravenous, Continuous, Dario Barter, MD  "

## 2024-10-28 NOTE — Anesthesia Procedure Notes (Signed)
 Procedure Name: Intubation Date/Time: 10/28/2024 9:02 AM  Performed by: Denean Pavon R, CRNAPre-anesthesia Checklist: Patient identified, Emergency Drugs available, Suction available and Patient being monitored Patient Re-evaluated:Patient Re-evaluated prior to induction Oxygen Delivery Method: Circle system utilized Preoxygenation: Pre-oxygenation with 100% oxygen Induction Type: IV induction Ventilation: Mask ventilation without difficulty, Two handed mask ventilation required and Oral airway inserted - appropriate to patient size Laryngoscope Size: McGrath and 3 Grade View: Grade II Tube type: Oral Number of attempts: 1 Airway Equipment and Method: Stylet and Oral airway Placement Confirmation: ETT inserted through vocal cords under direct vision, positive ETCO2 and breath sounds checked- equal and bilateral Secured at: 21 cm Tube secured with: Tape Dental Injury: Teeth and Oropharynx as per pre-operative assessment  Comments: Airway edematous and extremely anterior

## 2024-10-31 ENCOUNTER — Encounter: Payer: Self-pay | Admitting: Pulmonary Disease

## 2024-11-01 LAB — CYTOLOGY - NON PAP

## 2024-11-01 LAB — SURGICAL PATHOLOGY

## 2024-11-07 ENCOUNTER — Encounter: Payer: Self-pay | Admitting: Internal Medicine

## 2024-11-07 ENCOUNTER — Telehealth: Payer: Self-pay | Admitting: *Deleted

## 2024-11-07 ENCOUNTER — Inpatient Hospital Stay

## 2024-11-07 ENCOUNTER — Inpatient Hospital Stay: Attending: Internal Medicine | Admitting: Internal Medicine

## 2024-11-07 VITALS — BP 110/88 | HR 107 | Temp 98.1°F | Resp 18 | Ht 61.0 in | Wt 181.0 lb

## 2024-11-07 DIAGNOSIS — Z79899 Other long term (current) drug therapy: Secondary | ICD-10-CM | POA: Insufficient documentation

## 2024-11-07 DIAGNOSIS — Z87891 Personal history of nicotine dependence: Secondary | ICD-10-CM | POA: Insufficient documentation

## 2024-11-07 DIAGNOSIS — C3411 Malignant neoplasm of upper lobe, right bronchus or lung: Secondary | ICD-10-CM

## 2024-11-07 DIAGNOSIS — E785 Hyperlipidemia, unspecified: Secondary | ICD-10-CM | POA: Diagnosis not present

## 2024-11-07 DIAGNOSIS — C779 Secondary and unspecified malignant neoplasm of lymph node, unspecified: Secondary | ICD-10-CM | POA: Insufficient documentation

## 2024-11-07 DIAGNOSIS — F32A Depression, unspecified: Secondary | ICD-10-CM | POA: Insufficient documentation

## 2024-11-07 DIAGNOSIS — Z801 Family history of malignant neoplasm of trachea, bronchus and lung: Secondary | ICD-10-CM | POA: Insufficient documentation

## 2024-11-07 DIAGNOSIS — G893 Neoplasm related pain (acute) (chronic): Secondary | ICD-10-CM | POA: Insufficient documentation

## 2024-11-07 DIAGNOSIS — C7951 Secondary malignant neoplasm of bone: Secondary | ICD-10-CM | POA: Diagnosis not present

## 2024-11-07 LAB — CBC WITH DIFFERENTIAL/PLATELET
Abs Immature Granulocytes: 0.05 K/uL (ref 0.00–0.07)
Basophils Absolute: 0.1 K/uL (ref 0.0–0.1)
Basophils Relative: 1 %
Eosinophils Absolute: 0.2 K/uL (ref 0.0–0.5)
Eosinophils Relative: 2 %
HCT: 44.7 % (ref 36.0–46.0)
Hemoglobin: 13.4 g/dL (ref 12.0–15.0)
Immature Granulocytes: 1 %
Lymphocytes Relative: 18 %
Lymphs Abs: 2 K/uL (ref 0.7–4.0)
MCH: 24.4 pg — ABNORMAL LOW (ref 26.0–34.0)
MCHC: 30 g/dL (ref 30.0–36.0)
MCV: 81.4 fL (ref 80.0–100.0)
Monocytes Absolute: 1 K/uL (ref 0.1–1.0)
Monocytes Relative: 9 %
Neutro Abs: 7.7 K/uL (ref 1.7–7.7)
Neutrophils Relative %: 69 %
Platelets: 369 K/uL (ref 150–400)
RBC: 5.49 MIL/uL — ABNORMAL HIGH (ref 3.87–5.11)
RDW: 20.6 % — ABNORMAL HIGH (ref 11.5–15.5)
WBC: 11.1 K/uL — ABNORMAL HIGH (ref 4.0–10.5)
nRBC: 0 % (ref 0.0–0.2)

## 2024-11-07 LAB — COMPREHENSIVE METABOLIC PANEL WITH GFR
ALT: <5 U/L (ref 0–44)
AST: 13 U/L — ABNORMAL LOW (ref 15–41)
Albumin: 3.8 g/dL (ref 3.5–5.0)
Alkaline Phosphatase: 78 U/L (ref 38–126)
Anion gap: 13 (ref 5–15)
BUN: 26 mg/dL — ABNORMAL HIGH (ref 8–23)
CO2: 24 mmol/L (ref 22–32)
Calcium: 9.4 mg/dL (ref 8.9–10.3)
Chloride: 105 mmol/L (ref 98–111)
Creatinine, Ser: 1.35 mg/dL — ABNORMAL HIGH (ref 0.44–1.00)
GFR, Estimated: 39 mL/min — ABNORMAL LOW
Glucose, Bld: 96 mg/dL (ref 70–99)
Potassium: 3.8 mmol/L (ref 3.5–5.1)
Sodium: 141 mmol/L (ref 135–145)
Total Bilirubin: 0.3 mg/dL (ref 0.0–1.2)
Total Protein: 7 g/dL (ref 6.5–8.1)

## 2024-11-07 LAB — LACTATE DEHYDROGENASE: LDH: 203 U/L (ref 105–235)

## 2024-11-07 LAB — MISCELLANEOUS TEST

## 2024-11-07 MED ORDER — HYDROCODONE-ACETAMINOPHEN 5-325 MG PO TABS: ORAL_TABLET | ORAL | 0 refills | Status: DC

## 2024-11-07 NOTE — Progress Notes (Signed)
 [facility] CONSULT NOTE  Patient Care Team: [name], MD as PCP - General (Internal Medicine) [name], MD as Consulting Physician (Oncology) [name], RN as Oncology Nurse Navigator  CHIEF COMPLAINTS/PURPOSE OF CONSULTATION: lung cancer  Oncology History Overview Note  # JAN 6th, 2026-  No significant change in patient's known right upper lobe paramediastinal mass measuring 3.2 x 3.5 cm. This mass is continuous with the known smaller more inferior mass over the right suprahilar region measuring 2.2 x 2.5 cm. Subtle stable thickening of the adjacent interstitium as cannot exclude contiguous lymphangitis spread;  No significant change in mediastinal and right hilar adenopathy;  Several bilateral subcentimeter peripheral pulmonary nodules most notable over the left lower lobe without significant change;  1.4 x 2.1 cm left adrenal nodule appears slightly larger compared to the recent prior exam and is concerning for metastatic disease.  NON-SMALL CELL CARCINOMA.       NON-SMALL CELL CARCINOMA.       NON-SMALL CELL CARCINOMA.       NO MALIGNANT CELLS IDENTIFIED.       NUMEROUS LYMPHOCYTES PRESENT.       NO MALIGNANT CELLS IDENTIFIED.       NUMEROUS LYMPHOCYTES PRESENT.       RARE ATYPICAL CELLS CONSISTENT WITH NON-SMALL CELL CARCINOMA.    Diagnosis Note : - 6.There is insufficient cellularity in the cell blocks for  additional testing.    #Incidental findings on Imaging  CT , 2026: Emphysema;  Aortic atherosclerosis.I reviewed/discussed/counseled the patient.    Cancer of upper lobe of right lung (HCC)  11/07/2024 Initial Diagnosis   Cancer of upper lobe of right lung (HCC)   11/07/2024 Cancer Staging   Staging form: Lung, AJCC V9 - Clinical: Stage IVA (cT2, cN2b(f), pM1b) - Signed by [name], MD on 11/07/2024 Method of lymph node assessment: Fine needle aspiration     HISTORY OF PRESENTING ILLNESS: Patient ambulating- in wheel  chair. /Accompanied by son.   Kathryn Houston 84 y.o.  female pleasant patient with a   Discussed the use of AI scribe software for clinical note transcription with the patient, who gave verbal consent to proceed.  History of Present Illness   Kathryn Houston is an 84 year old woman with newly diagnosed stage IV non-small cell lung cancer who presents for initial oncology consultation.  She developed progressive left groin and hip pain beginning in December, initially attributed to muscle strain but worsening to the extent that she now requires a walker and wheelchair for ambulation. The pain is severe, limiting her ability to walk without assistance. Imaging revealed mild hip arthritis and a suspicious lesion in the left upper femur. She has trialed acetaminophen  and a short course of meloxicam with minimal relief, and previously used Lodine. Pain remains her most significant symptom, and she seeks improved pain control.  Concurrently, she experienced increasing dyspnea and cough, leading to hospital admission. She was not on supplemental oxygen prior to hospitalization but now requires home oxygen, with morning saturations of 94-95% off oxygen and 98% on oxygen. She continues to have chronic cough and exertional dyspnea. Imaging during admission revealed a large pulmonary mass, bilateral lung nodules, and emphysema. Biopsy confirmed non-small cell lung cancer. She has not yet undergone brain MRI and has not received chemotherapy, immunotherapy, or radiation.  She describes intermittent lightheadedness and cognitive 'fuzziness' when turning in bed. Her family notes episodes of confusion and irritability, as well as poor nutritional intake characterized by a  diet high in sugar and processed foods. There is a history of lower extremity swelling, but no current symptoms of lymphedema. She was previously active and independent prior to the onset of pain and respiratory symptoms.      Review of  Systems  Constitutional:  Positive for malaise/fatigue. Negative for chills, diaphoresis, fever and weight loss.  HENT:  Negative for nosebleeds and sore throat.   Eyes:  Negative for double vision.  Respiratory:  Positive for cough and shortness of breath. Negative for hemoptysis, sputum production and wheezing.   Cardiovascular:  Negative for chest pain, palpitations, orthopnea and leg swelling.  Gastrointestinal:  Negative for abdominal pain, blood in stool, constipation, diarrhea, heartburn, melena, nausea and vomiting.  Genitourinary:  Negative for dysuria, frequency and urgency.  Musculoskeletal:  Positive for back pain and joint pain.  Skin: Negative.  Negative for itching and rash.  Neurological:  Negative for dizziness, tingling, focal weakness, weakness and headaches.  Endo/Heme/Allergies:  Does not bruise/bleed easily.  Psychiatric/Behavioral:  Negative for depression. The patient is not nervous/anxious and does not have insomnia.     MEDICAL HISTORY:  Past Medical History:  Diagnosis Date   COPD (chronic obstructive pulmonary disease) (HCC)    GERD (gastroesophageal reflux disease)    Hyperlipidemia    Insomnia    Intracranial aneurysm    Polycythemia     SURGICAL HISTORY: Past Surgical History:  Procedure Laterality Date   APPENDECTOMY     BRAIN SURGERY  2006   for aneurysm   TONSILLECTOMY     VIDEO BRONCHOSCOPY N/A 10/28/2024   Procedure: BRONCHOSCOPY, VIDEO-ASSISTED;  Surgeon: Parris Manna, MD;  Location: [facility] ORS;  Service: Thoracic;  Laterality: N/A;   VIDEO BRONCHOSCOPY WITH ENDOBRONCHIAL ULTRASOUND N/A 10/28/2024   Procedure: BRONCHOSCOPY, WITH EBUS;  Surgeon: Parris Manna, MD;  Location: [facility] ORS;  Service: Thoracic;  Laterality: N/A;    SOCIAL HISTORY: Social History   Socioeconomic History   Marital status: Married    Spouse name: Not on file   Number of children: Not on file   Years of education: Not on file   Highest education level: Not on  file  Occupational History   Not on file  Tobacco Use   Smoking status: Former    Current packs/day: 1.00    Types: Cigarettes   Smokeless tobacco: Never  Vaping Use   Vaping status: Never Used  Substance and Sexual Activity   Alcohol  use: No   Drug use: Never   Sexual activity: Not on file  Other Topics Concern   Not on file  Social History Narrative   Not on file   Social Drivers of Health   Tobacco Use: Medium Risk (11/07/2024)   Patient History    Smoking Tobacco Use: Former    Smokeless Tobacco Use: Never    Passive Exposure: Not on file  Financial Resource Strain: Low Risk  (05/12/2024)   Received from [facility]   Overall Financial Resource Strain (CARDIA)    Difficulty of Paying Living Expenses: Not hard at all  Food Insecurity: No Food Insecurity (11/07/2024)   Epic    Worried About Radiation Protection Practitioner of Food in the Last Year: Never true    Ran Out of Food in the Last Year: Never true  Transportation Needs: No Transportation Needs (11/07/2024)   Epic    Lack of Transportation (Medical): No    Lack of Transportation (Non-Medical): No  Physical Activity: Not on file  Stress: Not on file  Social Connections: Unknown (10/13/2024)   Social Connection and Isolation Panel    Frequency of Communication with Friends and Family: Once a week    Frequency of Social Gatherings with Friends and Family: Once a week    Attends Religious Services: Never    Database Administrator or Organizations: Patient declined    Attends Banker Meetings: Patient declined    Marital Status: Married  Catering Manager Violence: Not At Risk (11/07/2024)   Epic    Fear of Current or Ex-Partner: No    Emotionally Abused: No    Physically Abused: No    Sexually Abused: No  Depression (PHQ2-9): Low Risk (11/07/2024)   Depression (PHQ2-9)    PHQ-2 Score: 0  Alcohol  Screen: Not on file  Housing: Low Risk (11/07/2024)   Epic    Unable to Pay for Housing in the Last Year:  No    Number of Times Moved in the Last Year: 0    Homeless in the Last Year: No  Utilities: Not At Risk (11/07/2024)   Epic    Threatened with loss of utilities: No  Health Literacy: Not on file    FAMILY HISTORY: Family History  Problem Relation Age of Onset   Lung cancer Maternal Aunt    Cancer Maternal Grandfather     ALLERGIES:  has no known allergies.  MEDICATIONS:  Current Outpatient Medications  Medication Sig Dispense Refill   CRANBERRY EXTRACT PO Take 1 tablet by mouth daily.     etodolac (LODINE) 400 MG tablet Take 400 mg by mouth 2 (two) times daily.     HYDROcodone -acetaminophen  (NORCO/VICODIN) 5-325 MG tablet 1/2 to 1 pill every 8 hours as needed 30 tablet 0   nicotine  (NICODERM CQ  - DOSED IN MG/24 HOURS) 21 mg/24hr patch Place 1 patch (21 mg total) onto the skin daily. 28 patch 0   pravastatin  (PRAVACHOL ) 40 MG tablet Take 40 mg by mouth at bedtime.     zolpidem  (AMBIEN ) 10 MG tablet Take 1 tablet by mouth at bedtime.     No current facility-administered medications for this visit.    PHYSICAL EXAMINATION:   Vitals:   11/07/24 1408  BP: 110/88  Pulse: (!) 107  Resp: 18  Temp: 98.1 F (36.7 C)  SpO2: 99%   Filed Weights   11/07/24 1408  Weight: 181 lb (82.1 kg)    Physical Exam Vitals and nursing note reviewed.  HENT:     Head: Normocephalic and atraumatic.     Mouth/Throat:     Pharynx: Oropharynx is clear.  Eyes:     Extraocular Movements: Extraocular movements intact.     Pupils: Pupils are equal, round, and reactive to light.  Cardiovascular:     Rate and Rhythm: Normal rate and regular rhythm.  Pulmonary:     Comments: Decreased breath sounds bilaterally.  Abdominal:     Palpations: Abdomen is soft.  Musculoskeletal:        General: Normal range of motion.     Cervical back: Normal range of motion.  Skin:    General: Skin is warm.  Neurological:     General: No focal deficit present.     Mental Status: She is alert and oriented  to person, place, and time.  Psychiatric:        Behavior: Behavior normal.        Judgment: Judgment normal.     LABORATORY DATA:  I have reviewed the data as listed Lab Results  Component  Value Date   WBC 8.5 10/13/2024   HGB 11.7 (L) 10/13/2024   HCT 39.9 10/13/2024   MCV 80.9 10/13/2024   PLT 377 10/13/2024   Recent Labs    10/13/24 1112 10/14/24 0954  NA 146* 145  K 3.1* 3.6  CL 108 107  CO2 27 28  GLUCOSE 103* 136*  BUN 10 14  CREATININE 0.85 0.97  CALCIUM 9.0 9.0  GFRNONAA >60 58*    RADIOGRAPHIC STUDIES: I have personally reviewed the radiological images as listed and agreed with the findings in the report. DG Chest Port 1 View Result Date: 10/28/2024 EXAM: 1 VIEW(S) XRAY OF THE CHEST 10/28/2024 10:36:00 AM COMPARISON: 10/13/2024 CLINICAL HISTORY: S/P bronchoscopy FINDINGS: LUNGS AND PLEURA: Right apical mass and airspace opacity, similar to recent CT. Diffuse interstitial prominence similar to prior study. No pleural effusion. No pneumothorax. HEART AND MEDIASTINUM: Aortic atherosclerosis. No acute abnormality of the cardiac and mediastinal silhouettes. BONES AND SOFT TISSUES: No acute osseous abnormality. IMPRESSION: 1. Unchanged right apical mass with associated airspace opacity and diffuse interstitial prominence. Electronically signed by: Michaeline Blanch MD 10/28/2024 04:34 PM EST RP Workstation: [id number]   DG C-Arm 1-60 Min-No Report Result Date: 10/28/2024 Fluoroscopy was utilized by the requesting physician.  No radiographic interpretation.   CT SUPER D CHEST WO MONARCH PILOT Result Date: 10/25/2024 CLINICAL DATA:  Right lung mass. EXAM: CT CHEST WITHOUT CONTRAST TECHNIQUE: Multidetector CT imaging of the chest was performed using thin slice collimation for electromagnetic bronchoscopy planning purposes, without intravenous contrast. RADIATION DOSE REDUCTION: This exam was performed according to the departmental dose-optimization program which includes automated  exposure control, adjustment of the mA and/or kV according to patient size and/or use of iterative reconstruction technique. COMPARISON:  CT 10/13/2024 and 10/06/2024 FINDINGS: Cardiovascular: Heart is normal size. Thoracic aorta is normal in caliber. Mild calcified plaque throughout the descending thoracic aorta. Remaining vascular structures are unremarkable on this noncontrast exam. Mediastinum/Nodes: No significant change in mediastinal and right hilar adenopathy with largest mediastinal node over the right paratracheal region measuring 2.4 cm without significant change. Remaining mediastinal structures are unremarkable. Lungs/Pleura: Mild to moderate centrilobular emphysematous disease is present. Again noted is patient's known right upper lobe paramediastinal mass measuring 3.2 x 3.5 cm without significant change. This mass is continuous with the known smaller more inferior mass over the right suprahilar region measuring 2.2 x 2.5 cm. Subtle thickening of the adjacent interstitium as cannot exclude contiguous spread. There are several bilateral subcentimeter peripheral pulmonary nodules most notable over the left lower lobe without significant change. No acute airspace process or effusion. Airways are unremarkable. Upper Abdomen: Calcified plaque over the visualized abdominal aorta. Again noted is a left adrenal nodule measuring 1.4 x 2.1 cm appears slightly larger compared to the recent prior exam and is concerning for metastatic disease cystic change over the kidneys. Musculoskeletal: No focal abnormality. IMPRESSION: 1. No significant change in patient's known right upper lobe paramediastinal mass measuring 3.2 x 3.5 cm. This mass is continuous with the known smaller more inferior mass over the right suprahilar region measuring 2.2 x 2.5 cm. Subtle stable thickening of the adjacent interstitium as cannot exclude contiguous lymphangitis spread. 2. No significant change in mediastinal and right hilar  adenopathy. 3. Several bilateral subcentimeter peripheral pulmonary nodules most notable over the left lower lobe without significant change. Recommend attention on follow-up. 4. 1.4 x 2.1 cm left adrenal nodule appears slightly larger compared to the recent prior exam and is concerning for metastatic disease. 5.  Emphysema. 6. Aortic atherosclerosis. Aortic Atherosclerosis (ICD10-I70.0) and Emphysema (ICD10-J43.9). Electronically Signed   By: Toribio Agreste M.D.   On: 10/25/2024 11:19   CT HIP LEFT WO CONTRAST Result Date: 10/25/2024 CLINICAL DATA:  Left hip pain EXAM: CT OF THE LEFT HIP WITHOUT CONTRAST TECHNIQUE: Multidetector CT imaging of the left hip was performed according to the standard protocol. Multiplanar CT image reconstructions were also generated. RADIATION DOSE REDUCTION: This exam was performed according to the departmental dose-optimization program which includes automated exposure control, adjustment of the mA and/or kV according to patient size and/or use of iterative reconstruction technique. COMPARISON:  10/06/2024 FINDINGS: Bones/Joint/Cartilage No acute fracture. No dislocation. No evidence of femoral head avascular necrosis. Mild osteoarthritis of the left hip with joint space narrowing and marginal osteophyte formation. No appreciable hip joint effusion. Included portion of the left hemipelvis appears intact without evidence of fracture or diastasis. There is a 1.6 x 0.7 cm lytic lesion within the anteromedial cortex of the subtrochanteric left femur (series 7, image 48). No pathologic fracture. No additional lytic or sclerotic bone lesions within the field of view. Ligaments Suboptimally assessed by CT. Muscles and Tendons No acute musculotendinous abnormality by CT. Soft tissues No fluid collection or hematoma about the left hip. No left inguinal lymphadenopathy. Colonic diverticulosis. IMPRESSION: 1. No acute fracture or dislocation of the left hip. 2. Mild osteoarthritis of the left  hip. 3. Lytic lesion within the proximal left femur measuring 1.6 x 0.7 cm. No pathologic fracture. Findings are concerning for a metastatic lesion given lung findings on recent chest CT. Electronically Signed   By: Mabel Converse D.O.   On: 10/25/2024 10:32   ECHOCARDIOGRAM COMPLETE Result Date: 10/14/2024    ECHOCARDIOGRAM REPORT   Patient Name:   Kathryn Houston Date of Exam: 10/14/2024 Medical Rec #:  [id number]            Height:       61.0 in Accession #:    [id number]           Weight:       160.0 lb Date of Birth:  [birth year]           BSA:          1.718 m Patient Age:    83 years             BP:           132/8 mmHg Patient Gender: F                    HR:           87 bpm. Exam Location:  [facility] Procedure: 2D Echo, Cardiac Doppler, Color Doppler, 3D Echo and Strain Analysis            (Both Spectral and Color Flow Doppler were utilized during            procedure).                                 MODIFIED REPORT: This report was modified by Cara JONETTA Lovelace MD on 10/14/2024 due to [place]                      intrep suggestive of possible amyloid.  Indications:     Dyspnea R06.00  History:         Patient has no prior history  of Echocardiogram examinations.                  Risk Factors:Dyslipidemia.  Sonographer:     Christopher Furnace Referring Phys:  [id number] CORT ONEIDA MANA Diagnosing Phys: Cara JONETTA Lovelace MD  Sonographer Comments: Global longitudinal strain was attempted. IMPRESSIONS  1. [place] suggest possible Amyloid.  2. Left ventricular ejection fraction, by estimation, is 55 to 60%. The left ventricle has normal function. The left ventricle has no regional wall motion abnormalities. There is mild concentric left ventricular hypertrophy. Left ventricular diastolic parameters are consistent with Grade I diastolic dysfunction (impaired relaxation). The average left ventricular global longitudinal strain is 6.3 %. The global longitudinal strain is abnormal.  3. Right ventricular systolic  function is normal. The right ventricular size is normal. Mildly increased right ventricular wall thickness.  4. The mitral valve is normal in structure. No evidence of mitral valve regurgitation.  5. The aortic valve is normal in structure. Aortic valve regurgitation is not visualized. Aortic valve sclerosis is present, with no evidence of aortic valve stenosis. FINDINGS  Left Ventricle: Left ventricular ejection fraction, by estimation, is 55 to 60%. The left ventricle has normal function. The left ventricle has no regional wall motion abnormalities. The average left ventricular global longitudinal strain is 6.3 %. Strain was performed and the global longitudinal strain is abnormal. The left ventricular internal cavity size was normal in size. There is mild concentric left ventricular hypertrophy. Left ventricular diastolic parameters are consistent with Grade I diastolic dysfunction (impaired relaxation). Right Ventricle: The right ventricular size is normal. Mildly increased right ventricular wall thickness. Right ventricular systolic function is normal. Left Atrium: Left atrial size was normal in size. Right Atrium: Right atrial size was normal in size. Pericardium: There is no evidence of pericardial effusion. Mitral Valve: The mitral valve is normal in structure. No evidence of mitral valve regurgitation. MV peak gradient, 8.5 mmHg. The mean mitral valve gradient is 4.0 mmHg. Tricuspid Valve: The tricuspid valve is normal in structure. Tricuspid valve regurgitation is trivial. Aortic Valve: The aortic valve is normal in structure. Aortic valve regurgitation is not visualized. Aortic valve sclerosis is present, with no evidence of aortic valve stenosis. Aortic valve mean gradient measures 4.5 mmHg. Aortic valve peak gradient measures 7.0 mmHg. Aortic valve area, by VTI measures 2.66 cm. Pulmonic Valve: The pulmonic valve was not well visualized. Pulmonic valve regurgitation is not visualized. Aorta: The  ascending aorta was not well visualized. IAS/Shunts: No atrial level shunt detected by color flow Doppler. Additional Comments: [place] suggest possible Amyloid. 3D was performed not requiring image post processing on an independent workstation and was normal.  LEFT VENTRICLE PLAX 2D LVIDd:         4.20 cm   Diastology LVIDs:         3.00 cm   LV e' medial:    7.40 cm/s LV PW:         1.10 cm   LV E/e' medial:  12.6 LV IVS:        1.10 cm   LV e' lateral:   7.18 cm/s LVOT diam:     2.00 cm   LV E/e' lateral: 13.0 LV SV:         68 LV SV Index:   40        2D Longitudinal Strain LVOT Area:     3.14 cm  2D Strain GLS (A4C):   7.1 % LV IVRT:  74 msec   2D Strain GLS (A3C):   4.1 %                          2D Strain GLS (A2C):   7.6 %                          2D Strain GLS Avg:     6.3 % RIGHT VENTRICLE RV Basal diam:  2.40 cm  PULMONARY VEINS RV Mid diam:    1.90 cm  Diastolic Velocity: 36.90 cm/s                          S/D Velocity:       1.80                          Systolic Velocity:  68.00 cm/s LEFT ATRIUM             Index        RIGHT ATRIUM           Index LA diam:        2.80 cm 1.63 cm/m   RA Area:     12.80 cm LA Vol (A2C):   24.2 ml 14.09 ml/m  RA Volume:   27.20 ml  15.83 ml/m LA Vol (A4C):   41.6 ml 24.22 ml/m LA Biplane Vol: 33.4 ml 19.44 ml/m  AORTIC VALVE AV Area (Vmax):    2.95 cm AV Area (Vmean):   2.60 cm AV Area (VTI):     2.66 cm AV Vmax:           132.00 cm/s AV Vmean:          97.100 cm/s AV VTI:            0.258 m AV Peak Grad:      7.0 mmHg AV Mean Grad:      4.5 mmHg LVOT Vmax:         124.00 cm/s LVOT Vmean:        80.300 cm/s LVOT VTI:          0.218 m LVOT/AV VTI ratio: 0.85  AORTA Ao Root diam: 2.90 cm MITRAL VALVE MV Area (PHT): 4.71 cm     SHUNTS MV Area VTI:   2.37 cm     Systemic VTI:  0.22 m MV Peak grad:  8.5 mmHg     Systemic Diam: 2.00 cm MV Mean grad:  4.0 mmHg MV Vmax:       1.46 m/s MV Vmean:      92.3 cm/s MV Decel Time: 161 msec MV E velocity: 93.30 cm/s  MV A velocity: 137.00 cm/s MV E/A ratio:  0.68 Dwayne D Callwood MD Electronically signed by Cara JONETTA Lovelace MD Signature Date/Time: 10/14/2024/12:51:00 PM    Final (Updated)    CT Angio Chest Pulmonary Embolism (PE) W or WO Contrast Result Date: 10/13/2024 CLINICAL DATA:  Shortness of breath EXAM: CT ANGIOGRAPHY CHEST WITH CONTRAST TECHNIQUE: Multidetector CT imaging of the chest was performed using the standard protocol during bolus administration of intravenous contrast. Multiplanar CT image reconstructions and MIPs were obtained to evaluate the vascular anatomy. RADIATION DOSE REDUCTION: This exam was performed according to the departmental dose-optimization program which includes automated exposure control, adjustment of the mA and/or kV according to patient size and/or use of iterative reconstruction technique. CONTRAST:  75mL OMNIPAQUE  IOHEXOL  350 MG/ML SOLN COMPARISON:  Chest x-ray 10/13/2024, chest CT 10/06/2024 FINDINGS: Cardiovascular: Satisfactory opacification of the pulmonary arteries to the segmental level. No evidence of pulmonary embolism. Moderate aortic atherosclerosis. No aneurysm. No dissection. Mild cardiomegaly. No sizable pericardial effusion Mediastinum/Nodes: Patent trachea. No thyroid  mass. Enlarged right paratracheal node measures 2.7 cm. Large right hilar node measuring 2.8 cm on series 4, image 56. Borderline prevascular node measuring 8 mm. Esophagus within normal limits. Lungs/Pleura: Emphysema. Spiculated right upper lobe lung mass measuring 3.6 x 3.5 cm on series 6, image 31. Appears contiguous with right suprahilar soft tissue nodule measuring 2.4 x 2.9 cm on series 6, image 40. Total craniocaudal measurement of 4.8 cm on series 7, image 96. Increased small moderate right pleural effusion. Trace left-sided pleural effusion. Suspect passive atelectasis at the right base. Multiple small pulmonary nodules, most lower lobe measuring up to 5 mm on series 6, image 97. Upper  Abdomen: No acute finding.  Small hiatal hernia Musculoskeletal: No acute or suspicious osseous abnormality Review of the MIP images confirms the above findings. IMPRESSION: 1. Negative for acute pulmonary embolus or aortic dissection. 2. Redemonstrated spiculated right upper lobe pulmonary mass concerning for primary bronchogenic neoplasm. This may be contiguous with additional right suprahilar mass as discussed above. Enlarged right paratracheal and hilar nodes suspect for metastatic disease. 3. Increased small to moderate right pleural effusion. Trace left-sided pleural effusion. 4. Multiple small pulmonary nodules measuring up to 5 mm, attention on follow-up imaging 5. Emphysema. 6. Aortic atherosclerosis. Aortic Atherosclerosis (ICD10-I70.0) and Emphysema (ICD10-J43.9). Electronically Signed   By: Luke Bun M.D.   On: 10/13/2024 18:25   DG Chest 2 View Result Date: 10/13/2024 CLINICAL DATA:  Shortness of breath for 3 days. EXAM: DG CHEST 2V COMPARISON:  Chest x-ray 01/06/2022.  Chest CT 10/06/2024. FINDINGS: The cardio pericardial silhouette is enlarged. Soft tissue fullness noted right suprahilar region. Peripheral interstitial coarsening evident with some patchy airspace disease at the right base. No acute bony abnormality. IMPRESSION: 1. Masslike soft tissue fullness in the right suprahilar region. See report for chest CT from 1 week ago. 2. Patchy airspace disease at the right base. I personally called these results to Dr. Jossie in the emergency department and referenced the chest CT of 10/06/2024. Electronically Signed   By: Camellia Candle M.D.   On: 10/13/2024 12:09   US  Venous Img Lower Unilateral Left (DVT) Result Date: 10/10/2024 CLINICAL DATA:  Left lower extremity pain and edema. EXAM: LEFT LOWER EXTREMITY VENOUS DOPPLER ULTRASOUND TECHNIQUE: Gray-scale sonography with graded compression, as well as color Doppler and duplex ultrasound were performed to evaluate the lower extremity deep  venous systems from the level of the common femoral vein and including the common femoral, femoral, profunda femoral, popliteal and calf veins including the posterior tibial, peroneal and gastrocnemius veins when visible. The superficial great saphenous vein was also interrogated. Spectral Doppler was utilized to evaluate flow at rest and with distal augmentation maneuvers in the common femoral, femoral and popliteal veins. COMPARISON:  None Available. FINDINGS: Contralateral Common Femoral Vein: Respiratory phasicity is normal and symmetric with the symptomatic side. No evidence of thrombus. Normal compressibility. Common Femoral Vein: No evidence of thrombus. Normal compressibility, respiratory phasicity and response to augmentation. Saphenofemoral Junction: No evidence of thrombus. Normal compressibility and flow on color Doppler imaging. Profunda Femoral Vein: No evidence of thrombus. Normal compressibility and flow on color Doppler imaging. Femoral Vein: No evidence of thrombus. Normal compressibility, respiratory phasicity and response to  augmentation. Popliteal Vein: No evidence of thrombus. Normal compressibility, respiratory phasicity and response to augmentation. Calf Veins: No evidence of thrombus. Normal compressibility and flow on color Doppler imaging. Superficial Great Saphenous Vein: No evidence of thrombus. Normal compressibility. Venous Reflux:  None. Other Findings: No evidence of superficial thrombophlebitis or abnormal fluid collection. IMPRESSION: No evidence of left lower extremity deep venous thrombosis. Electronically Signed   By: Marcey Moan M.D.   On: 10/10/2024 14:15     Cancer of upper lobe of right lung (HCC) # DEC 25th, 2025- 1. Negative for acute pulmonary embolus or aortic dissection;  Redemonstrated spiculated right upper lobe pulmonary mass concerning for primary bronchogenic neoplasm. This may be contiguous with additional right suprahilar mass as discussed above.  Enlarged right paratracheal and hilar nodes suspect for metastatic disease;  Increased small to moderate right pleural effusion. Trace left-sided pleural effusion;  Multiple small pulmonary nodules measuring up to 5 mm, attention on follow-up imaging. # JAN 6th, 2026-  No significant change in patient's known right upper lobe paramediastinal mass measuring 3.2 x 3.5 cm. This mass is continuous with the known smaller more inferior mass over the right suprahilar region measuring 2.2 x 2.5 cm. Subtle stable thickening of the adjacent interstitium as cannot exclude contiguous lymphangitis spread;  No significant change in mediastinal and right hilar adenopathy;  Several bilateral subcentimeter peripheral pulmonary nodules most notable over the left lower lobe without significant change;  1.4 x 2.1 cm left adrenal nodule appears slightly larger compared to the recent prior exam and is concerning for metastatic disease.; NON-SMALL CELL CARCINOMA [EBUS- Dr.Aleskerov]. 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 Likely osseous metastasis in left proximal femur. Functional status impaired by severe pain and hypoxemia. Prognosis with chemotherapy is typically one to two years. Systemic therapy may prolong survival and improve symptoms but is not curative. Hospice available if disease-directed therapy is declined. - Ordered PET scan for disease extent and staging confirmation. - Ordered brain MRI for intracranial metastases evaluation. - Referred to radiation oncology for palliative radiation evaluation to left hip/femur. - Referred to nutritionist for dietary support. - Discussed potential chemoimmunotherapy pending further workup and biomarker testing; treatment  not curative, may prolong life and improve symptoms, carries risks including infection, neuropathy, nausea, vomiting, diarrhea; indefinite duration with monitoring and possible discontinuation if not tolerated or disease progresses. - Discussed repeat imaging every few months if systemic therapy initiated. - Discussed referral to palliative care for symptom management. - Discussed hospice option if disease-directed therapy not pursued.  # Jan 2026 -left hip lytic lesion - new onset pain due to neoplastic disease- Severe pain in left groin and hip likely due to metastatic proximal femur involvement, impairing mobility and quality of life. Current analgesics inadequate. Opioid therapy may cause drowsiness.- Recommended hydrocodone  for pain management, starting at lowest effective dose, titrating as needed; discussed drowsiness risk and advised cautious use.- Referred to palliative care for pain management and analgesic regimen adjustment. - Referred to radiation oncology for palliative radiation consideration to left hip/femur.  Consider Zometa.     Thank you Dr.Aleskerov MD for allowing me to participate in the care of your pleasant patient. Please do not hesitate to contact me with questions or concerns in the interim.  Will also review  at the tumor conference this week. Check PDL-1 levels- send message to haley  # DISPOSITION: # evaluation with Nutrition re: malnutrition/lung cancer # labs today- # Tempus blood test re: stage IV lung cancer # PET scan ASAP # MRI Brain ASAP # referral Dr.Chrystal re: left femoral lesion/Lung cancer # referral to [facility]-re: Left hip pain/lung cancer-  # follow up in appx 2  weeks- MD; no labs-  Dr.B  Above plan of care was discussed with patient/family in detail.  My contact information was given to the patient/family.     Cindy JONELLE Joe, MD 11/07/2024 3:49 PM

## 2024-11-07 NOTE — Assessment & Plan Note (Addendum)
#   DEC 25th, 2025- 1. Negative for acute pulmonary embolus or aortic dissection;  Redemonstrated spiculated right upper lobe pulmonary mass concerning for primary bronchogenic neoplasm. This may be contiguous with additional right suprahilar mass as discussed above. Enlarged right paratracheal and hilar nodes suspect for metastatic disease;  Increased small to moderate right pleural effusion. Trace left-sided pleural effusion;  Multiple small pulmonary nodules measuring up to 5 mm, attention on follow-up imaging. # JAN 6th, 2026-  No significant change in patient's known right upper lobe paramediastinal mass measuring 3.2 x 3.5 cm. This mass is continuous with the known smaller more inferior mass over the right suprahilar region measuring 2.2 x 2.5 cm. Subtle stable thickening of the adjacent interstitium as cannot exclude contiguous lymphangitis spread;  No significant change in mediastinal and right hilar adenopathy;  Several bilateral subcentimeter peripheral pulmonary nodules most notable over the left lower lobe without significant change;  1.4 x 2.1 cm left adrenal nodule appears slightly larger compared to the recent prior exam and is concerning for metastatic disease.; NON-SMALL CELL CARCINOMA [EBUS- Dr.Aleskerov]. 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 Likely osseous metastasis in left proximal femur. Functional status impaired by severe pain and hypoxemia. Prognosis with chemotherapy is typically one to two years. Systemic therapy may prolong survival and improve symptoms but is not curative. Hospice available if disease-directed therapy is declined. - Ordered PET scan for disease extent and staging confirmation. - Ordered brain MRI for  intracranial metastases evaluation. - Referred to radiation oncology for palliative radiation evaluation to left hip/femur. - Referred to nutritionist for dietary support. - Discussed potential chemoimmunotherapy pending further workup and biomarker testing; treatment not curative, may prolong life and improve symptoms, carries risks including infection, neuropathy, nausea, vomiting, diarrhea; indefinite duration with monitoring and possible discontinuation if not tolerated or disease progresses. - Discussed repeat imaging every few months if systemic therapy initiated. - Discussed referral to palliative care for symptom management. - Discussed hospice option if disease-directed therapy not pursued.  # Jan 2026 -left hip lytic lesion - new onset pain due to neoplastic disease- Severe pain in left groin and hip likely due to metastatic proximal femur involvement, impairing mobility and quality of life. Current analgesics inadequate. Opioid therapy may cause drowsiness.- Recommended hydrocodone  for pain management, starting at lowest effective dose, titrating as needed; discussed drowsiness risk and advised cautious use.- Referred to palliative care for pain management and analgesic regimen adjustment. - Referred to radiation oncology for palliative radiation consideration to left hip/femur.  Consider Zometa.     Thank you Dr.Aleskerov MD for allowing me to participate in the care of your pleasant patient. Please do not hesitate to contact me with questions or concerns in the interim.  Will also review at the tumor conference this week. Check PDL-1 levels- send message to haley  # DISPOSITION: # evaluation with Nutrition re: malnutrition/lung cancer # labs today- # Tempus blood test re: stage IV lung cancer # PET scan ASAP # MRI Brain ASAP # referral Dr.Chrystal re: left femoral lesion/Lung cancer # referral to [facility]-re: Left hip pain/lung cancer-  # follow up in appx 2  weeks- MD; no labs-   Dr.B

## 2024-11-07 NOTE — Telephone Encounter (Signed)
 Son called triage line to clarify if his mom's apt was at 1pm today. I called son back. Caller verified using pt's full name and dob prior to discussing PHI . I verified that his mom's apt is today at 2pm. He thanked me for returning his phone call.

## 2024-11-07 NOTE — Progress Notes (Signed)
 No questions/concerns at this time.

## 2024-11-08 ENCOUNTER — Encounter: Payer: Self-pay | Admitting: *Deleted

## 2024-11-08 LAB — CEA: CEA: 2 ng/mL (ref 0.0–4.7)

## 2024-11-09 ENCOUNTER — Ambulatory Visit
Admission: RE | Admit: 2024-11-09 | Discharge: 2024-11-09 | Disposition: A | Discharge status: Home / Self Care | Source: Ambulatory Visit | Attending: Internal Medicine | Admitting: Internal Medicine

## 2024-11-09 DIAGNOSIS — C7972 Secondary malignant neoplasm of left adrenal gland: Secondary | ICD-10-CM | POA: Diagnosis not present

## 2024-11-09 DIAGNOSIS — C7951 Secondary malignant neoplasm of bone: Secondary | ICD-10-CM | POA: Insufficient documentation

## 2024-11-09 DIAGNOSIS — R590 Localized enlarged lymph nodes: Secondary | ICD-10-CM | POA: Insufficient documentation

## 2024-11-09 DIAGNOSIS — C3401 Malignant neoplasm of right main bronchus: Secondary | ICD-10-CM | POA: Insufficient documentation

## 2024-11-09 DIAGNOSIS — J439 Emphysema, unspecified: Secondary | ICD-10-CM | POA: Diagnosis not present

## 2024-11-09 DIAGNOSIS — C3411 Malignant neoplasm of upper lobe, right bronchus or lung: Secondary | ICD-10-CM | POA: Insufficient documentation

## 2024-11-09 DIAGNOSIS — C3402 Malignant neoplasm of left main bronchus: Secondary | ICD-10-CM | POA: Insufficient documentation

## 2024-11-09 DIAGNOSIS — I700 Atherosclerosis of aorta: Secondary | ICD-10-CM | POA: Diagnosis not present

## 2024-11-09 DIAGNOSIS — C7971 Secondary malignant neoplasm of right adrenal gland: Secondary | ICD-10-CM | POA: Insufficient documentation

## 2024-11-09 LAB — GLUCOSE, CAPILLARY: Glucose-Capillary: 96 mg/dL (ref 70–99)

## 2024-11-09 MED ORDER — FLUDEOXYGLUCOSE F - 18 (FDG) INJECTION
9.4000 | Freq: Once | INTRAVENOUS | Status: AC | PRN
  Administered 2024-11-09: 9.84 via INTRAVENOUS

## 2024-11-10 ENCOUNTER — Inpatient Hospital Stay

## 2024-11-10 ENCOUNTER — Encounter: Payer: Self-pay | Admitting: *Deleted

## 2024-11-10 ENCOUNTER — Inpatient Hospital Stay: Admitting: Hospice and Palliative Medicine

## 2024-11-10 ENCOUNTER — Encounter: Payer: Self-pay | Admitting: Hospice and Palliative Medicine

## 2024-11-10 VITALS — BP 95/70 | HR 90 | Temp 98.3°F | Resp 18 | Wt 166.9 lb

## 2024-11-10 DIAGNOSIS — C3411 Malignant neoplasm of upper lobe, right bronchus or lung: Secondary | ICD-10-CM | POA: Diagnosis not present

## 2024-11-10 NOTE — Progress Notes (Signed)
 "    Palliative Medicine [facility] at [facility] Telephone:([phone] Fax:([phone]) [phone]   Name: Kathryn Houston Date: 11/10/2024 MRN: [id number]  DOB: [birth year]  Patient Care Team: [name], MD as PCP - General (Internal Medicine) [name], MD as Consulting Physician (Oncology) [name], RN as Oncology Nurse Navigator    REASON FOR CONSULTATION: Kathryn Houston is a 84 y.o. 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  Patient has had pain with left hip lytic lesion.  She was referred to palliative care to address goals and provide ongoing symptom management.  SOCIAL HISTORY:     reports that she has quit smoking. Her smoking use included cigarettes. She has never used smokeless tobacco. She reports that she does not drink alcohol  and does not use drugs.  Patient is married lives at home with her husband.  She has 2 sons who live nearby.  Patient previously worked as an print production planner for production designer, theatre/television/film.  ADVANCE DIRECTIVES:  Not on file  CODE STATUS:   PAST MEDICAL HISTORY: Past Medical History:  Diagnosis Date   COPD (chronic obstructive pulmonary disease) (HCC)    GERD (gastroesophageal reflux disease)    Hyperlipidemia    Insomnia    Intracranial aneurysm    Polycythemia     PAST SURGICAL HISTORY:  Past Surgical History:  Procedure Laterality Date   APPENDECTOMY     BRAIN SURGERY  2006   for aneurysm   TONSILLECTOMY     VIDEO BRONCHOSCOPY N/A 10/28/2024   Procedure: BRONCHOSCOPY, VIDEO-ASSISTED;  Surgeon: Parris Manna, MD;  Location: [facility] ORS;  Service: Thoracic;  Laterality: N/A;   VIDEO BRONCHOSCOPY WITH ENDOBRONCHIAL ULTRASOUND N/A 10/28/2024   Procedure: BRONCHOSCOPY, WITH EBUS;  Surgeon: Parris Manna, MD;  Location: [facility] ORS;  Service: Thoracic;  Laterality: N/A;    HEMATOLOGY/ONCOLOGY HISTORY:  Oncology History  Overview Note  # JAN 6th, 2026-  No significant change in patient's known right upper lobe paramediastinal mass measuring 3.2 x 3.5 cm. This mass is continuous with the known smaller more inferior mass over the right suprahilar region measuring 2.2 x 2.5 cm. Subtle stable thickening of the adjacent interstitium as cannot exclude contiguous lymphangitis spread;  No significant change in mediastinal and right hilar adenopathy;  Several bilateral subcentimeter peripheral pulmonary nodules most notable over the left lower lobe without significant change;  1.4 x 2.1 cm left adrenal nodule appears slightly larger compared to the recent prior exam and is concerning for metastatic disease.  NON-SMALL CELL CARCINOMA.       NON-SMALL CELL CARCINOMA.       NON-SMALL CELL CARCINOMA.       NO MALIGNANT CELLS IDENTIFIED.       NUMEROUS LYMPHOCYTES PRESENT.       NO MALIGNANT CELLS IDENTIFIED.       NUMEROUS LYMPHOCYTES PRESENT.       RARE ATYPICAL CELLS CONSISTENT WITH NON-SMALL CELL CARCINOMA.    Diagnosis Note : - 6.There is insufficient cellularity in the cell blocks for  additional testing.    #Incidental findings on Imaging  CT , 2026: Emphysema;  Aortic atherosclerosis.I reviewed/discussed/counseled the patient.    Cancer of upper lobe of right lung (HCC)  11/07/2024 Initial Diagnosis   Cancer of upper lobe of right lung (HCC)   11/07/2024 Cancer Staging   Staging form: Lung, AJCC V9 - Clinical: Stage IVA (cT2, cN2b(f), pM1b) - Signed by Rennie,  Cindy SAUNDERS, MD on 11/07/2024 Method of lymph node assessment: Fine needle aspiration     ALLERGIES:  has no known allergies.  MEDICATIONS:  Current Outpatient Medications  Medication Sig Dispense Refill   CRANBERRY EXTRACT PO Take 1 tablet by mouth daily.     etodolac (LODINE) 400 MG tablet Take 400 mg by mouth 2 (two) times daily.     HYDROcodone -acetaminophen  (NORCO/VICODIN) 5-325 MG tablet 1/2 to 1 pill every 8 hours as needed 30 tablet 0    nicotine  (NICODERM CQ  - DOSED IN MG/24 HOURS) 21 mg/24hr patch Place 1 patch (21 mg total) onto the skin daily. 28 patch 0   pravastatin  (PRAVACHOL ) 40 MG tablet Take 40 mg by mouth at bedtime.     zolpidem  (AMBIEN ) 10 MG tablet Take 1 tablet by mouth at bedtime.     No current facility-administered medications for this visit.    VITAL SIGNS: There were no vitals taken for this visit. There were no vitals filed for this visit.  Estimated body mass index is 34.2 kg/m as calculated from the following:   Height as of 11/07/24: 5' 1 (1.549 m).   Weight as of 11/07/24: 181 lb (82.1 kg).  LABS: CBC:    Component Value Date/Time   WBC 11.1 (H) 11/07/2024 1536   HGB 13.4 11/07/2024 1536   HCT 44.7 11/07/2024 1536   PLT 369 11/07/2024 1536   MCV 81.4 11/07/2024 1536   NEUTROABS 7.7 11/07/2024 1536   LYMPHSABS 2.0 11/07/2024 1536   MONOABS 1.0 11/07/2024 1536   EOSABS 0.2 11/07/2024 1536   BASOSABS 0.1 11/07/2024 1536   Comprehensive Metabolic Panel:    Component Value Date/Time   NA 141 11/07/2024 1536   K 3.8 11/07/2024 1536   CL 105 11/07/2024 1536   CO2 24 11/07/2024 1536   BUN 26 (H) 11/07/2024 1536   CREATININE 1.35 (H) 11/07/2024 1536   GLUCOSE 96 11/07/2024 1536   CALCIUM 9.4 11/07/2024 1536   AST 13 (L) 11/07/2024 1536   ALT <5 11/07/2024 1536   ALKPHOS 78 11/07/2024 1536   BILITOT 0.3 11/07/2024 1536   PROT 7.0 11/07/2024 1536   ALBUMIN 3.8 11/07/2024 1536    RADIOGRAPHIC STUDIES: NM PET Image Initial (PI) Skull Base To Thigh (F-18 FDG) Result Date: 11/09/2024 CLINICAL DATA:  Initial treatment strategy for lung cancer. EXAM: NUCLEAR MEDICINE PET SKULL BASE TO THIGH TECHNIQUE: 9.8 mCi F-18 FDG was injected intravenously. Full-ring PET imaging was performed from the skull base to thigh after the radiotracer. CT data was obtained and used for attenuation correction and anatomic localization. Fasting blood glucose: 96 mg/dl COMPARISON:  CT chest 98/93/7973 and CT left  hip 10/25/2024. FINDINGS: Mediastinal blood pool activity: SUV max 4.2 Liver activity: SUV max NA NECK: Asymmetric right nasopharyngeal metabolism without a CT correlate. No hypermetabolic lymph nodes. Incidental CT findings: None. CHEST: Hypermetabolic 8 mm right supraclavicular lymph node, SUV max 19.4. Hypermetabolic bilateral mediastinal and right hilar adenopathy. Index right hilar lymph node measures 2.4 cm, SUV max 22.4. Hypermetabolic right upper lobe mass and nodule measure 3.0 x 3.1 cm (6/35), SUV max 21.6 and 2.4 x 2.8 cm (6/40), SUV max 22.7, respectively. No additional abnormal hypermetabolism. Incidental CT findings: Atherosclerotic calcification of the aorta. Heart is enlarged. No pericardial or pleural effusion. Centrilobular emphysema. ABDOMEN/PELVIS: Hypermetabolic left adrenal mass measures 2.2 x 3.1 cm, SUV max 12.3. Hypermetabolic subcutaneous nodule along the ventral right lateral pelvis measures 10 mm (6/109), SUV max 4.0. No additional abnormal hypermetabolism.  Incidental CT findings: Low-attenuation lesions in the kidneys. No specific follow-up necessary. Gastric wall thickening. SKELETON: 9 mm cortical lucent lesion in the volar aspect of the proximal left femur (6/142), SUV max 16.6. No additional abnormal hypermetabolism. Incidental CT findings: Degenerative changes in the spine. IMPRESSION: 1. Stage IV primary bronchogenic carcinoma as evidenced by hypermetabolic right upper lobe nodule and mass, right hilar/bilateral mediastinal/right supraclavicular adenopathy, left adrenal metastasis and proximal left femoral metastasis. 2. Small hypermetabolic subcutaneous nodule along the ventral right lateral pelvis, indeterminate. 3. Gastric wall thickening. 4.  Aortic atherosclerosis (ICD10-I70.0). 5.  Emphysema (ICD10-J43.9). Electronically Signed   By: Newell Eke M.D.   On: 11/09/2024 09:45   DG Chest Port 1 View Result Date: 10/28/2024 EXAM: 1 VIEW(S) XRAY OF THE CHEST 10/28/2024  10:36:00 AM COMPARISON: 10/13/2024 CLINICAL HISTORY: S/P bronchoscopy FINDINGS: LUNGS AND PLEURA: Right apical mass and airspace opacity, similar to recent CT. Diffuse interstitial prominence similar to prior study. No pleural effusion. No pneumothorax. HEART AND MEDIASTINUM: Aortic atherosclerosis. No acute abnormality of the cardiac and mediastinal silhouettes. BONES AND SOFT TISSUES: No acute osseous abnormality. IMPRESSION: 1. Unchanged right apical mass with associated airspace opacity and diffuse interstitial prominence. Electronically signed by: Michaeline Blanch MD 10/28/2024 04:34 PM EST RP Workstation: [id number]   DG C-Arm 1-60 Min-No Report Result Date: 10/28/2024 Fluoroscopy was utilized by the requesting physician.  No radiographic interpretation.   CT SUPER D CHEST WO MONARCH PILOT Result Date: 10/25/2024 CLINICAL DATA:  Right lung mass. EXAM: CT CHEST WITHOUT CONTRAST TECHNIQUE: Multidetector CT imaging of the chest was performed using thin slice collimation for electromagnetic bronchoscopy planning purposes, without intravenous contrast. RADIATION DOSE REDUCTION: This exam was performed according to the departmental dose-optimization program which includes automated exposure control, adjustment of the mA and/or kV according to patient size and/or use of iterative reconstruction technique. COMPARISON:  CT 10/13/2024 and 10/06/2024 FINDINGS: Cardiovascular: Heart is normal size. Thoracic aorta is normal in caliber. Mild calcified plaque throughout the descending thoracic aorta. Remaining vascular structures are unremarkable on this noncontrast exam. Mediastinum/Nodes: No significant change in mediastinal and right hilar adenopathy with largest mediastinal node over the right paratracheal region measuring 2.4 cm without significant change. Remaining mediastinal structures are unremarkable. Lungs/Pleura: Mild to moderate centrilobular emphysematous disease is present. Again noted is patient's known right  upper lobe paramediastinal mass measuring 3.2 x 3.5 cm without significant change. This mass is continuous with the known smaller more inferior mass over the right suprahilar region measuring 2.2 x 2.5 cm. Subtle thickening of the adjacent interstitium as cannot exclude contiguous spread. There are several bilateral subcentimeter peripheral pulmonary nodules most notable over the left lower lobe without significant change. No acute airspace process or effusion. Airways are unremarkable. Upper Abdomen: Calcified plaque over the visualized abdominal aorta. Again noted is a left adrenal nodule measuring 1.4 x 2.1 cm appears slightly larger compared to the recent prior exam and is concerning for metastatic disease cystic change over the kidneys. Musculoskeletal: No focal abnormality. IMPRESSION: 1. No significant change in patient's known right upper lobe paramediastinal mass measuring 3.2 x 3.5 cm. This mass is continuous with the known smaller more inferior mass over the right suprahilar region measuring 2.2 x 2.5 cm. Subtle stable thickening of the adjacent interstitium as cannot exclude contiguous lymphangitis spread. 2. No significant change in mediastinal and right hilar adenopathy. 3. Several bilateral subcentimeter peripheral pulmonary nodules most notable over the left lower lobe without significant change. Recommend attention on follow-up. 4. 1.4 x 2.1  cm left adrenal nodule appears slightly larger compared to the recent prior exam and is concerning for metastatic disease. 5. Emphysema. 6. Aortic atherosclerosis. Aortic Atherosclerosis (ICD10-I70.0) and Emphysema (ICD10-J43.9). Electronically Signed   By: Toribio Agreste M.D.   On: 10/25/2024 11:19   CT HIP LEFT WO CONTRAST Result Date: 10/25/2024 CLINICAL DATA:  Left hip pain EXAM: CT OF THE LEFT HIP WITHOUT CONTRAST TECHNIQUE: Multidetector CT imaging of the left hip was performed according to the standard protocol. Multiplanar CT image reconstructions were  also generated. RADIATION DOSE REDUCTION: This exam was performed according to the departmental dose-optimization program which includes automated exposure control, adjustment of the mA and/or kV according to patient size and/or use of iterative reconstruction technique. COMPARISON:  10/06/2024 FINDINGS: Bones/Joint/Cartilage No acute fracture. No dislocation. No evidence of femoral head avascular necrosis. Mild osteoarthritis of the left hip with joint space narrowing and marginal osteophyte formation. No appreciable hip joint effusion. Included portion of the left hemipelvis appears intact without evidence of fracture or diastasis. There is a 1.6 x 0.7 cm lytic lesion within the anteromedial cortex of the subtrochanteric left femur (series 7, image 48). No pathologic fracture. No additional lytic or sclerotic bone lesions within the field of view. Ligaments Suboptimally assessed by CT. Muscles and Tendons No acute musculotendinous abnormality by CT. Soft tissues No fluid collection or hematoma about the left hip. No left inguinal lymphadenopathy. Colonic diverticulosis. IMPRESSION: 1. No acute fracture or dislocation of the left hip. 2. Mild osteoarthritis of the left hip. 3. Lytic lesion within the proximal left femur measuring 1.6 x 0.7 cm. No pathologic fracture. Findings are concerning for a metastatic lesion given lung findings on recent chest CT. Electronically Signed   By: Mabel Converse D.O.   On: 10/25/2024 10:32   ECHOCARDIOGRAM COMPLETE Result Date: 10/14/2024    ECHOCARDIOGRAM REPORT   Patient Name:   Kathryn Houston Karaffa Date of Exam: 10/14/2024 Medical Rec #:  [id number]            Height:       61.0 in Accession #:    [id number]           Weight:       160.0 lb Date of Birth:  [birth year]           BSA:          1.718 m Patient Age:    83 years             BP:           132/8 mmHg Patient Gender: F                    HR:           87 bpm. Exam Location:  [facility] Procedure: 2D Echo, Cardiac  Doppler, Color Doppler, 3D Echo and Strain Analysis            (Both Spectral and Color Flow Doppler were utilized during            procedure).                                 MODIFIED REPORT: This report was modified by Cara JONETTA Lovelace MD on 10/14/2024 due to [place]                      intrep suggestive of possible amyloid.  Indications:  Dyspnea R06.00  History:         Patient has no prior history of Echocardiogram examinations.                  Risk Factors:Dyslipidemia.  Sonographer:     Christopher Furnace Referring Phys:  [id number] CORT ONEIDA MANA Diagnosing Phys: Cara JONETTA Lovelace MD  Sonographer Comments: Global longitudinal strain was attempted. IMPRESSIONS  1. [place] suggest possible Amyloid.  2. Left ventricular ejection fraction, by estimation, is 55 to 60%. The left ventricle has normal function. The left ventricle has no regional wall motion abnormalities. There is mild concentric left ventricular hypertrophy. Left ventricular diastolic parameters are consistent with Grade I diastolic dysfunction (impaired relaxation). The average left ventricular global longitudinal strain is 6.3 %. The global longitudinal strain is abnormal.  3. Right ventricular systolic function is normal. The right ventricular size is normal. Mildly increased right ventricular wall thickness.  4. The mitral valve is normal in structure. No evidence of mitral valve regurgitation.  5. The aortic valve is normal in structure. Aortic valve regurgitation is not visualized. Aortic valve sclerosis is present, with no evidence of aortic valve stenosis. FINDINGS  Left Ventricle: Left ventricular ejection fraction, by estimation, is 55 to 60%. The left ventricle has normal function. The left ventricle has no regional wall motion abnormalities. The average left ventricular global longitudinal strain is 6.3 %. Strain was performed and the global longitudinal strain is abnormal. The left ventricular internal cavity size was normal in size.  There is mild concentric left ventricular hypertrophy. Left ventricular diastolic parameters are consistent with Grade I diastolic dysfunction (impaired relaxation). Right Ventricle: The right ventricular size is normal. Mildly increased right ventricular wall thickness. Right ventricular systolic function is normal. Left Atrium: Left atrial size was normal in size. Right Atrium: Right atrial size was normal in size. Pericardium: There is no evidence of pericardial effusion. Mitral Valve: The mitral valve is normal in structure. No evidence of mitral valve regurgitation. MV peak gradient, 8.5 mmHg. The mean mitral valve gradient is 4.0 mmHg. Tricuspid Valve: The tricuspid valve is normal in structure. Tricuspid valve regurgitation is trivial. Aortic Valve: The aortic valve is normal in structure. Aortic valve regurgitation is not visualized. Aortic valve sclerosis is present, with no evidence of aortic valve stenosis. Aortic valve mean gradient measures 4.5 mmHg. Aortic valve peak gradient measures 7.0 mmHg. Aortic valve area, by VTI measures 2.66 cm. Pulmonic Valve: The pulmonic valve was not well visualized. Pulmonic valve regurgitation is not visualized. Aorta: The ascending aorta was not well visualized. IAS/Shunts: No atrial level shunt detected by color flow Doppler. Additional Comments: [place] suggest possible Amyloid. 3D was performed not requiring image post processing on an independent workstation and was normal.  LEFT VENTRICLE PLAX 2D LVIDd:         4.20 cm   Diastology LVIDs:         3.00 cm   LV e' medial:    7.40 cm/s LV PW:         1.10 cm   LV E/e' medial:  12.6 LV IVS:        1.10 cm   LV e' lateral:   7.18 cm/s LVOT diam:     2.00 cm   LV E/e' lateral: 13.0 LV SV:         68 LV SV Index:   40        2D Longitudinal Strain LVOT Area:     3.14  cm  2D Strain GLS (A4C):   7.1 % LV IVRT:       74 msec   2D Strain GLS (A3C):   4.1 %                          2D Strain GLS (A2C):   7.6 %                           2D Strain GLS Avg:     6.3 % RIGHT VENTRICLE RV Basal diam:  2.40 cm  PULMONARY VEINS RV Mid diam:    1.90 cm  Diastolic Velocity: 36.90 cm/s                          S/D Velocity:       1.80                          Systolic Velocity:  68.00 cm/s LEFT ATRIUM             Index        RIGHT ATRIUM           Index LA diam:        2.80 cm 1.63 cm/m   RA Area:     12.80 cm LA Vol (A2C):   24.2 ml 14.09 ml/m  RA Volume:   27.20 ml  15.83 ml/m LA Vol (A4C):   41.6 ml 24.22 ml/m LA Biplane Vol: 33.4 ml 19.44 ml/m  AORTIC VALVE AV Area (Vmax):    2.95 cm AV Area (Vmean):   2.60 cm AV Area (VTI):     2.66 cm AV Vmax:           132.00 cm/s AV Vmean:          97.100 cm/s AV VTI:            0.258 m AV Peak Grad:      7.0 mmHg AV Mean Grad:      4.5 mmHg LVOT Vmax:         124.00 cm/s LVOT Vmean:        80.300 cm/s LVOT VTI:          0.218 m LVOT/AV VTI ratio: 0.85  AORTA Ao Root diam: 2.90 cm MITRAL VALVE MV Area (PHT): 4.71 cm     SHUNTS MV Area VTI:   2.37 cm     Systemic VTI:  0.22 m MV Peak grad:  8.5 mmHg     Systemic Diam: 2.00 cm MV Mean grad:  4.0 mmHg MV Vmax:       1.46 m/s MV Vmean:      92.3 cm/s MV Decel Time: 161 msec MV E velocity: 93.30 cm/s MV A velocity: 137.00 cm/s MV E/A ratio:  0.68 Dwayne D Callwood MD Electronically signed by Cara JONETTA Lovelace MD Signature Date/Time: 10/14/2024/12:51:00 PM    Final (Updated)    CT Angio Chest Pulmonary Embolism (PE) W or WO Contrast Result Date: 10/13/2024 CLINICAL DATA:  Shortness of breath EXAM: CT ANGIOGRAPHY CHEST WITH CONTRAST TECHNIQUE: Multidetector CT imaging of the chest was performed using the standard protocol during bolus administration of intravenous contrast. Multiplanar CT image reconstructions and MIPs were obtained to evaluate the vascular anatomy. RADIATION DOSE REDUCTION: This exam was performed according to the departmental dose-optimization program which includes automated exposure  control, adjustment of the mA and/or kV  according to patient size and/or use of iterative reconstruction technique. CONTRAST:  75mL OMNIPAQUE  IOHEXOL  350 MG/ML SOLN COMPARISON:  Chest x-ray 10/13/2024, chest CT 10/06/2024 FINDINGS: Cardiovascular: Satisfactory opacification of the pulmonary arteries to the segmental level. No evidence of pulmonary embolism. Moderate aortic atherosclerosis. No aneurysm. No dissection. Mild cardiomegaly. No sizable pericardial effusion Mediastinum/Nodes: Patent trachea. No thyroid  mass. Enlarged right paratracheal node measures 2.7 cm. Large right hilar node measuring 2.8 cm on series 4, image 56. Borderline prevascular node measuring 8 mm. Esophagus within normal limits. Lungs/Pleura: Emphysema. Spiculated right upper lobe lung mass measuring 3.6 x 3.5 cm on series 6, image 31. Appears contiguous with right suprahilar soft tissue nodule measuring 2.4 x 2.9 cm on series 6, image 40. Total craniocaudal measurement of 4.8 cm on series 7, image 96. Increased small moderate right pleural effusion. Trace left-sided pleural effusion. Suspect passive atelectasis at the right base. Multiple small pulmonary nodules, most lower lobe measuring up to 5 mm on series 6, image 97. Upper Abdomen: No acute finding.  Small hiatal hernia Musculoskeletal: No acute or suspicious osseous abnormality Review of the MIP images confirms the above findings. IMPRESSION: 1. Negative for acute pulmonary embolus or aortic dissection. 2. Redemonstrated spiculated right upper lobe pulmonary mass concerning for primary bronchogenic neoplasm. This may be contiguous with additional right suprahilar mass as discussed above. Enlarged right paratracheal and hilar nodes suspect for metastatic disease. 3. Increased small to moderate right pleural effusion. Trace left-sided pleural effusion. 4. Multiple small pulmonary nodules measuring up to 5 mm, attention on follow-up imaging 5. Emphysema. 6. Aortic atherosclerosis. Aortic Atherosclerosis (ICD10-I70.0) and  Emphysema (ICD10-J43.9). Electronically Signed   By: Luke Bun M.D.   On: 10/13/2024 18:25   DG Chest 2 View Result Date: 10/13/2024 CLINICAL DATA:  Shortness of breath for 3 days. EXAM: DG CHEST 2V COMPARISON:  Chest x-ray 01/06/2022.  Chest CT 10/06/2024. FINDINGS: The cardio pericardial silhouette is enlarged. Soft tissue fullness noted right suprahilar region. Peripheral interstitial coarsening evident with some patchy airspace disease at the right base. No acute bony abnormality. IMPRESSION: 1. Masslike soft tissue fullness in the right suprahilar region. See report for chest CT from 1 week ago. 2. Patchy airspace disease at the right base. I personally called these results to Dr. Jossie in the emergency department and referenced the chest CT of 10/06/2024. Electronically Signed   By: Camellia Candle M.D.   On: 10/13/2024 12:09    PERFORMANCE STATUS (ECOG) : 2 - Symptomatic, <50% confined to bed  Review of Systems Unless otherwise noted, a complete review of systems is negative.  Physical Exam General: NAD Pulmonary: Unlabored Extremities: no edema, no joint deformities Skin: no rashes Neurological: Weakness but otherwise nonfocal  IMPRESSION: Patient with recent diagnosis of metastatic non-small cell lung cancer with bone and lymph metastasis.  She underwent PET scan yesterday.  Patient has osseous left hip metastasis, which is causing her significant discomfort and limiting her mobility.  Patient has tried Norco half tablet, which she found effective and tolerated.  However, she has only taken 1 dose of Norco.  Recommended that she maximize Norco and consider scheduling dosing 3 times daily.  Discussed importance of maintaining bowel regimen to prevent opioid-induced constipation.  Patient is pending evaluation by radiation oncology.  Hopefully, XRT will help improve overall pain.  Could potentially consider RFA if needed.  Patient did not have benefit on meloxicam.  Could  consider short-term trial of steroid  if needed.  Patient has previously completed ACP documents but plans to redo those.  I sent her home with ACP documents and MOST form today.  PLAN: - Continue current scope of treatment - Norco as needed - Daily bowel regimen - Patient pending evaluation for XRT - RTC 2 weeks   Patient expressed understanding and was in agreement with this plan. She also understands that She can call the clinic at any time with any questions, concerns, or complaints.     Time Total: 20 minutes  Visit consisted of counseling and education dealing with the complex and emotionally intense issues of symptom management and palliative care in the setting of serious and potentially life-threatening illness.Greater than 50%  of this time was spent counseling and coordinating care related to the above assessment and plan.  Signed by: Fonda Mower, PhD, NP-C   "

## 2024-11-10 NOTE — Progress Notes (Signed)
 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  Past medical history of COPD, GERD, HLD, polycythemia.  Plan of care not determined at this time.   Met with patient and son.  Patient reports decreased appetite, no desire.  Also does not want to eat due to pain in leg and difficulty getting up to the bathroom to urinate and have bowel movement.  Husband prepares meals and does grocery shopping.  Decreased intake since the end of Dec 2025.  Usually has coffee and bagel for breakfast.  Never eats lunch.  Dinner maybe chicken and broccoli, microwave meal.  Does not drink oral nutrition supplements as has not tried them.     Medications: reviewed   Labs: reviewed   Anthropometrics:   Height: 61 inches Weight: 166 lb 14.4 oz  Son says 181 lb weight was reported weight 160 lb on 12/25 (hospital admission wt) BMI: 31   Estimated Energy Needs  Kcals: 8349-8124 Protein: 90-112 g Fluid: >   NUTRITION DIAGNOSIS: Inadequate oral intake related to cancer, pain in leg as evidenced by decreased appetite   INTERVENTION:  Discussed ways to add calories and protein in diet.  Handout provided Encouraged nibbling q 2 hours Samples of ensure complete, ensure plus, boost VHC, CIB, boost breeze given for patient to try Contact information provided   MONITORING, EVALUATION, GOAL: weight trends, intake   Next Visit: to be determined based on treatment  Kaitrin Seybold B. Dasie SOLON, CSO, LDN Registered Dietitian [phone]

## 2024-11-10 NOTE — Progress Notes (Signed)
 Attempted to contact pt's son, Tanda, to review recommendations received from tumor board discussion. LVM to return my call to further discuss. Awaiting call back.

## 2024-11-15 ENCOUNTER — Ambulatory Visit: Admission: RE | Admit: 2024-11-15 | Source: Ambulatory Visit

## 2024-11-16 NOTE — Addendum Note (Signed)
 Addended by: [name] on: 11/16/2024 10:49 AM   Modules accepted: Orders

## 2024-11-16 NOTE — Addendum Note (Signed)
 Addended by: [name] on: 11/16/2024 10:19 AM   Modules accepted: Orders

## 2024-11-16 NOTE — Progress Notes (Signed)
 Spoke with pt's son regarding tumor board recommendations and pt's son in agreement with proceeding with lymph node biopsy and circulogene testing. Appt scheduled for lab only tomorrow at 12:45p. Will send message to specialty scheduling to schedule lymph node biopsy. Informed pt's son that will call with her appt for second biopsy once scheduled. Instructed to call back with any questions or needs. Pt's son verbalized understanding

## 2024-11-16 NOTE — Progress Notes (Signed)
 Luverne Aran, MD sent to [facility] for US  guided right supraclavicular LN biopsy; local only. Any IR.  GY       Previous Messages    ----- Message ----- From: [facility] Sent: 11/16/2024   2:32 PM EST To: Aran Luverne, MD Subject: ATTN: Dr KANDICE Luverne   US  Biopsy                IR Approval Request:   Procedure:      US  guided FNA/core RT supraclavicular LN biopsy  Reason:          RT supraclavicular LN  History:           PET Scan    11/09/2024  Provider:        Dr Cindy Joe, MD  Provider Contact #    [facility]    [phone]   **Dr Damaris RN said that you had approved this at the last Tumor Board Conference 11/10/2024

## 2024-11-17 ENCOUNTER — Encounter: Payer: Self-pay | Admitting: Internal Medicine

## 2024-11-17 ENCOUNTER — Inpatient Hospital Stay

## 2024-11-17 ENCOUNTER — Ambulatory Visit: Admission: RE | Admit: 2024-11-17 | Source: Ambulatory Visit

## 2024-11-17 NOTE — Progress Notes (Signed)
 Unable to draw blood sample for Circulogene testing. Lab draw cancelled and pt's son made aware. Informed that will arrange for home/mobile phlebotomy and that [facility] will call pt's son to schedule visit. Pt's son verbalized understanding.

## 2024-11-21 ENCOUNTER — Ambulatory Visit
Admission: RE | Admit: 2024-11-21 | Discharge: 2024-11-21 | Disposition: A | Source: Ambulatory Visit | Attending: Internal Medicine

## 2024-11-21 DIAGNOSIS — C3411 Malignant neoplasm of upper lobe, right bronchus or lung: Secondary | ICD-10-CM

## 2024-11-21 MED ORDER — GADOBUTROL 1 MMOL/ML IV SOLN
7.0000 mL | Freq: Once | INTRAVENOUS | Status: AC | PRN
  Administered 2024-11-21: 7 mL via INTRAVENOUS

## 2024-11-22 ENCOUNTER — Ambulatory Visit
Admission: RE | Admit: 2024-11-22 | Discharge: 2024-11-22 | Disposition: A | Source: Ambulatory Visit | Attending: Radiation Oncology | Admitting: Radiation Oncology

## 2024-11-22 ENCOUNTER — Encounter: Payer: Self-pay | Admitting: Radiation Oncology

## 2024-11-22 ENCOUNTER — Encounter: Payer: Self-pay | Admitting: *Deleted

## 2024-11-22 ENCOUNTER — Other Ambulatory Visit: Payer: Self-pay | Admitting: *Deleted

## 2024-11-22 VITALS — BP 147/87 | HR 84 | Temp 98.3°F | Resp 16 | Wt 168.0 lb

## 2024-11-22 DIAGNOSIS — R918 Other nonspecific abnormal finding of lung field: Secondary | ICD-10-CM

## 2024-11-22 MED ORDER — HYDROCODONE-ACETAMINOPHEN 5-325 MG PO TABS: ORAL_TABLET | ORAL | 0 refills | Status: AC

## 2024-11-22 NOTE — Progress Notes (Signed)
 Met with patient and her son during initial consult with Dr. Lenn. Reviewed upcoming appts. Updated regarding circulogene testing and that a blood collection kit has been shipped to her residence and once received her home phlebotomy will be arranged by [facility]. All questions answered during visit. Instructed to call with any further questions or needs. Pt and son verbalized understanding. Nothing further needed at this time.

## 2024-11-23 ENCOUNTER — Ambulatory Visit
Admission: RE | Admit: 2024-11-23 | Discharge: 2024-11-23 | Attending: Radiation Oncology | Admitting: Radiation Oncology

## 2024-11-23 ENCOUNTER — Encounter: Payer: Self-pay | Admitting: *Deleted

## 2024-11-23 DIAGNOSIS — C3411 Malignant neoplasm of upper lobe, right bronchus or lung: Secondary | ICD-10-CM

## 2024-11-23 NOTE — Progress Notes (Signed)
 Met with patient and her son during CT simulation appt today. All questions answered during visit. Pt's son requested if pt can receive referral for home health- PT, OT, and nursing. Referral has been placed. Instructed to call with any further questions or needs. Pt and family verbalized understanding.

## 2024-11-24 ENCOUNTER — Ambulatory Visit
Admission: RE | Admit: 2024-11-24 | Discharge: 2024-11-24 | Attending: Radiation Oncology | Admitting: Radiation Oncology

## 2024-11-25 ENCOUNTER — Encounter: Payer: Self-pay | Admitting: Internal Medicine

## 2024-11-25 ENCOUNTER — Inpatient Hospital Stay: Admitting: Hospice and Palliative Medicine

## 2024-11-25 ENCOUNTER — Telehealth: Payer: Self-pay

## 2024-11-25 ENCOUNTER — Other Ambulatory Visit: Payer: Self-pay | Admitting: *Deleted

## 2024-11-25 ENCOUNTER — Inpatient Hospital Stay: Attending: Internal Medicine | Admitting: Internal Medicine

## 2024-11-25 ENCOUNTER — Encounter: Payer: Self-pay | Admitting: *Deleted

## 2024-11-25 DIAGNOSIS — C3411 Malignant neoplasm of upper lobe, right bronchus or lung: Secondary | ICD-10-CM

## 2024-11-25 MED ORDER — FENTANYL 25 MCG/HR TD PT72: 1.0000 | MEDICATED_PATCH | TRANSDERMAL | 0 refills | Status: AC

## 2024-11-25 MED ORDER — PREDNISONE 20 MG PO TABS: ORAL_TABLET | ORAL | 0 refills | Status: AC

## 2024-11-25 NOTE — Assessment & Plan Note (Addendum)
#   Stage IV non-small cell lung cancer with metastasis to bone and lymph nodes- DEC 25th, 2025- 1. Negative for acute pulmonary embolus or aortic dissection;  Redemonstrated spiculated right upper lobe pulmonary mass concerning for primary bronchogenic neoplasm. This may be contiguous with additional right suprahilar mass as discussed above. Enlarged right paratracheal and hilar nodes suspect for metastatic disease;  Increased small to moderate right pleural effusion. Trace left-sided pleural effusion;  Multiple small pulmonary nodules measuring up to 5 mm, attention on follow-up imaging. # JAN 6th, 2026-  No significant change in patient's known right upper lobe paramediastinal mass measuring 3.2 x 3.5 cm. This mass is continuous with the known smaller more inferior mass over the right suprahilar region measuring 2.2 x 2.5 cm. Subtle stable thickening of the adjacent interstitium as cannot exclude contiguous lymphangitis spread;  No significant change in mediastinal and right hilar adenopathy;  Several bilateral subcentimeter peripheral pulmonary nodules most notable over the left lower lobe without significant change;  1.4 x 2.1 cm left adrenal nodule appears slightly larger compared to the recent prior exam and is concerning for metastatic disease.; NON-SMALL CELL CARCINOMA [EBUS- Dr.Aleskerov]. There is insufficient cellularity in the cell blocks for  additional testing; # JAN 2026- PET scan:  Stage IV primary bronchogenic carcinoma as evidenced by hypermetabolic right upper lobe nodule and mass, right hilar/bilateral mediastinal/right supraclavicular adenopathy, left adrenal metastasis and proximal left femoral metastasis. Small hypermetabolic subcutaneous nodule along the ventral right lateral pelvis, indeterminate. JAN 2026- MRI Brain- NEG. awaiting PDL-1 levels-; repeat Bx- neck- biopsy- pending.   # Discussed patient will need systemic therapy-which could include chemoimmunotherapy versus immunotherapy  alone.  I am concerned of patient's tolerance to chemo-immunotherapy.  Awaiting PD-L1 testing my preference is to proceed with immunotherapy alone/Keytruda every 3 weeks.  Again is because the survival is anywhere between 1 to 2 years average.  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  # Jan 2026 -left hip lytic lesion - new onset pain due to neoplastic disease- Severe pain in left groin and hip likely due to metastatic proximal femur involvement-status post evaluation with radiation oncology.  Currently planning radiation on February 9.  Recommend adding fentanyl  patch 25 mcg every 3 days.  And also continue half hydrocodone  every 8 hours.  # Poor nutrition difficulty breathing-no worsening cough.  Recommend PET is on 40 mg a day for 1 week and then 20 mg a day.  Patient not diabetic.     # DISPOSITION: # follow up TBD-  Dr.B  # I reviewed the blood work- with the patient in detail; also reviewed the imaging independently [as summarized above]; and with the patient in detail.

## 2024-11-25 NOTE — Progress Notes (Signed)
 11/21/24 MRI brain, PET 11/09/24, and bx lymph nodes 11/28/24.  C/o that pain has spread to arms, legs, vagina swollen and sore. Feels like everything is hurting.  Son would like to talk about her memory loss.  SPO2 is normal at home but pt states she has been having trouble breathing x2 day. Mostly at night.  Son states she is not following the nutrition that was suggested by Joli.

## 2024-11-25 NOTE — Progress Notes (Signed)
 [name] CONSULT NOTE  Patient Care Team: [name], MD as PCP - General (Internal Medicine) [name], MD as Consulting Physician (Oncology) [name], RN as Oncology Nurse Navigator  CHIEF COMPLAINTS/PURPOSE OF CONSULTATION: lung cancer  Oncology History Overview Note  # JAN 6th, 2026-  No significant change in patient's known right upper lobe paramediastinal mass measuring 3.2 x 3.5 cm. This mass is continuous with the known smaller more inferior mass over the right suprahilar region measuring 2.2 x 2.5 cm. Subtle stable thickening of the adjacent interstitium as cannot exclude contiguous lymphangitis spread;  No significant change in mediastinal and right hilar adenopathy;  Several bilateral subcentimeter peripheral pulmonary nodules most notable over the left lower lobe without significant change;  1.4 x 2.1 cm left adrenal nodule appears slightly larger compared to the recent prior exam and is concerning for metastatic disease.  NON-SMALL CELL CARCINOMA.       NON-SMALL CELL CARCINOMA.       NON-SMALL CELL CARCINOMA.       NO MALIGNANT CELLS IDENTIFIED.       NUMEROUS LYMPHOCYTES PRESENT.       NO MALIGNANT CELLS IDENTIFIED.       NUMEROUS LYMPHOCYTES PRESENT.       RARE ATYPICAL CELLS CONSISTENT WITH NON-SMALL CELL CARCINOMA.    Diagnosis Note : - 6.There is insufficient cellularity in the cell blocks for  additional testing.    #Incidental findings on Imaging  CT , 2026: Emphysema;  Aortic atherosclerosis.I reviewed/discussed/counseled the patient.    Cancer of upper lobe of right lung (HCC)  11/07/2024 Initial Diagnosis   Cancer of upper lobe of right lung (HCC)   11/07/2024 Cancer Staging   Staging form: Lung, AJCC V9 - Clinical: Stage IVA (cT2, cN2b(f), pM1b) - Signed by [name], MD on 11/07/2024 Method of lymph node assessment: Fine needle aspiration     HISTORY OF PRESENTING ILLNESS: Patient ambulating- in wheel  chair. /Accompanied by son.   Kathryn Houston 84 y.o.  female pleasant patient with lung cancer is here for a follow up.   Discussed the use of AI scribe software for clinical note transcription with the patient, who gave verbal consent to proceed.  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  She has stage IV right upper lobe non-small cell lung cancer with PET imaging demonstrating a right lung mass, multiple lymphadenopathies in the neck and chest, and osseous metastasis to the left hip. Brain MRI revealed no intracranial metastases but did show age-related vascular changes. She is not currently receiving systemic therapy and is awaiting PD-L1 testing and supraclavicular lymph node biopsy to guide treatment. Radiation therapy to the left hip is planned. There is no evidence of liver or other critical organ involvement on imaging.  She experiences severe, persistent bone pain localized to the left hip, correlating with the site of osseous metastasis. The pain is exacerbated by movement and ambulation, requiring use of a walker. Hydrocodone  (half tablet two to three times daily) provides partial relief, but pain recurs with activity. She has not had any falls. She has been evaluated by radiation oncology for pain management and is scheduled to begin radiation therapy to the left hip. She denies constipation despite ongoing opioid use; Miralax is available as needed.  Nutritional  intake is profoundly reduced due to marked anorexia and aversion to the taste and odor of oral nutritional supplements. She consumes minimal food, often limited to coffee, peanut butter crackers, bananas, and small amounts of nutritional shakes, with daily caloric intake frequently below 700 kcal and  sometimes as low as 300 kcal. Multiple supplement flavors and preparations have been trialed without success. She prefers pills over liquid supplements. Family members provide daily support and encourage intake, but adherence remains poor. She does not drink adequate water. She expresses fatigue and weakness, and is concerned that continued weight loss and declining strength may preclude her from receiving cancer-directed therapies.  She describes intermittent nocturnal dyspnea, characterized by brief episodes of difficulty breathing at night that resolve after a few deep breaths. She denies daytime dyspnea, cough, or hemoptysis. She quit smoking on October 13, 2024, and has not had a cough since cessation. She reports that her lungs are not currently bothersome, though she did have dyspnea at the time of initial diagnosis.  Cognitive changes have been noted by her family for over a year, including frequent forgetfulness, difficulty recalling conversations, and confusion regarding the timeline of treatments and tests. She attributes some of this to stress and having a lot on my mind. Family members are concerned about her ability to manage medications, nutrition, and self-care. She has not experienced any falls or acute changes in consciousness.  She expresses emotional readiness for any outcome and prioritizes her son s and grandchild in her outlook. Family members are actively involved in her care and decision-making.       Review of Systems  Constitutional:  Positive for malaise/fatigue. Negative for chills, diaphoresis, fever and weight loss.  HENT:  Negative for nosebleeds and sore throat.   Eyes:  Negative for double vision.  Respiratory:  Positive for cough and shortness of breath. Negative for hemoptysis, sputum production and wheezing.   Cardiovascular:  Negative for chest pain, palpitations, orthopnea and leg swelling.  Gastrointestinal:  Negative for abdominal pain, blood in stool,  constipation, diarrhea, heartburn, melena, nausea and vomiting.  Genitourinary:  Negative for dysuria, frequency and urgency.  Musculoskeletal:  Positive for back pain and joint pain.  Skin: Negative.  Negative for itching and rash.  Neurological:  Negative for dizziness, tingling, focal weakness, weakness and headaches.  Endo/Heme/Allergies:  Does not bruise/bleed easily.  Psychiatric/Behavioral:  Negative for depression. The patient is not nervous/anxious and does not have insomnia.     MEDICAL HISTORY:  Past Medical History:  Diagnosis Date   COPD (chronic obstructive pulmonary disease) (HCC)    GERD (gastroesophageal reflux disease)    Hyperlipidemia    Insomnia    Intracranial aneurysm    Polycythemia     SURGICAL HISTORY: Past Surgical History:  Procedure Laterality Date   APPENDECTOMY     BRAIN SURGERY  2006   for aneurysm   TONSILLECTOMY     VIDEO BRONCHOSCOPY N/A 10/28/2024   Procedure: BRONCHOSCOPY, VIDEO-ASSISTED;  Surgeon: Parris Manna, MD;  Location: [facility] ORS;  Service: Thoracic;  Laterality: N/A;   VIDEO BRONCHOSCOPY WITH ENDOBRONCHIAL ULTRASOUND N/A 10/28/2024   Procedure: BRONCHOSCOPY, WITH EBUS;  Surgeon: Parris Manna, MD;  Location: [facility] ORS;  Service: Thoracic;  Laterality: N/A;    SOCIAL HISTORY: Social History   Socioeconomic History   Marital status: Married    Spouse name: Not on file   Number of children: Not on file   Years of education: Not on file   Highest education level: Not on file  Occupational History   Not on file  Tobacco Use   Smoking status: Former    Current packs/day: 1.00    Types: Cigarettes   Smokeless tobacco: Never  Vaping Use   Vaping status: Never Used  Substance and Sexual Activity   Alcohol  use: No   Drug use: Never   Sexual activity: Not on file  Other Topics Concern   Not on file  Social History Narrative   Not on file   Social Drivers of Health   Tobacco Use: Medium Risk (11/25/2024)   Patient History     Smoking Tobacco Use: Former    Smokeless Tobacco Use: Never    Passive Exposure: Not on file  Financial Resource Strain: Low Risk  (11/17/2024)   Received from [facility]   Overall Financial Resource Strain (CARDIA)    Difficulty of Paying Living Expenses: Not hard at all  Food Insecurity: No Food Insecurity (11/17/2024)   Received from [facility]   Epic    Within the past 12 months, you worried that your food would run out before you got the money to buy more.: Never true    Within the past 12 months, the food you bought just didn't last and you didn't have money to get more.: Never true  Transportation Needs: No Transportation Needs (11/17/2024)   Received from [facility] - Transportation    In the past 12 months, has lack of transportation kept you from medical appointments or from getting medications?: No    Lack of Transportation (Non-Medical): No  Physical Activity: Not on file  Stress: Not on file  Social Connections: Unknown (10/13/2024)   Social Connection and Isolation Panel    Frequency of Communication with Friends and Family: Once a week    Frequency of Social Gatherings with Friends and Family: Once a week    Attends Religious Services: Never    Database Administrator or Organizations: Patient declined    Attends Banker Meetings: Patient declined    Marital Status: Married  Catering Manager Violence: Not At Risk (11/07/2024)   Epic    Fear of Current or Ex-Partner: No    Emotionally Abused: No    Physically Abused: No    Sexually Abused: No  Depression (PHQ2-9): Medium Risk (11/25/2024)   Depression (PHQ2-9)    PHQ-2 Score: 8  Alcohol  Screen: Not on file  Housing: Low Risk  (11/17/2024)   Received from [facility]   Epic    In the last 12 months, was there a time when you were not able to pay the mortgage or rent on time?: No    In the past 12 months, how many times have  you moved where you were living?: 0    At any time in the past 12 months, were you homeless or living in a shelter (including now)?: No  Utilities: Not At Risk (11/17/2024)   Received from [facility]   Epic    In the past 12 months has the electric, gas, oil, or water company threatened to shut off services in your home?: No  Health Literacy: Not on file    FAMILY HISTORY: Family History  Problem Relation Age of Onset   Lung cancer Maternal Aunt    Cancer Maternal Grandfather     ALLERGIES:  has no known allergies.  MEDICATIONS:  Current Outpatient Medications  Medication Sig Dispense Refill   CRANBERRY  EXTRACT PO Take 1 tablet by mouth daily.     etodolac (LODINE) 400 MG tablet Take 400 mg by mouth 2 (two) times daily.     fentaNYL  (DURAGESIC ) 25 MCG/HR Place 1 patch onto the skin every 3 (three) days. 10 patch 0   HYDROcodone -acetaminophen  (NORCO/VICODIN) 5-325 MG tablet 1/2 to 1 pill every 8 hours as needed 30 tablet 0   nicotine  (NICODERM CQ  - DOSED IN MG/24 HOURS) 21 mg/24hr patch Place 1 patch (21 mg total) onto the skin daily. 28 patch 0   pravastatin  (PRAVACHOL ) 40 MG tablet Take 40 mg by mouth at bedtime.     predniSONE  (DELTASONE ) 20 MG tablet Take 2 pills a day/and 1 week and then 1 pill a day.  Take it in the morning with food 30 tablet 0   zolpidem  (AMBIEN ) 10 MG tablet Take 1 tablet by mouth at bedtime.     No current facility-administered medications for this visit.    PHYSICAL EXAMINATION:   Vitals:   11/25/24 1321 11/25/24 1335  BP: (!) 143/99 (!) 164/90  Pulse: 82   Resp: 20   Temp: 98 F (36.7 C)   SpO2: 99%    Filed Weights   11/25/24 1321  Weight: 165 lb 14.4 oz (75.3 kg)    Physical Exam Vitals and nursing note reviewed.  HENT:     Head: Normocephalic and atraumatic.     Mouth/Throat:     Pharynx: Oropharynx is clear.  Eyes:     Extraocular Movements: Extraocular movements intact.     Pupils: Pupils are equal, round,  and reactive to light.  Cardiovascular:     Rate and Rhythm: Normal rate and regular rhythm.  Pulmonary:     Comments: Decreased breath sounds bilaterally.  Abdominal:     Palpations: Abdomen is soft.  Musculoskeletal:        General: Normal range of motion.     Cervical back: Normal range of motion.  Skin:    General: Skin is warm.  Neurological:     General: No focal deficit present.     Mental Status: She is alert and oriented to person, place, and time.  Psychiatric:        Behavior: Behavior normal.        Judgment: Judgment normal.     LABORATORY DATA:  I have reviewed the data as listed Lab Results  Component Value Date   WBC 11.1 (H) 11/07/2024   HGB 13.4 11/07/2024   HCT 44.7 11/07/2024   MCV 81.4 11/07/2024   PLT 369 11/07/2024   Recent Labs    10/13/24 1112 10/14/24 0954 11/07/24 1536  NA 146* 145 141  K 3.1* 3.6 3.8  CL 108 107 105  CO2 27 28 24   GLUCOSE 103* 136* 96  BUN 10 14 26*  CREATININE 0.85 0.97 1.35*  CALCIUM 9.0 9.0 9.4  GFRNONAA >60 58* 39*  PROT  --   --  7.0  ALBUMIN  --   --  3.8  AST  --   --  13*  ALT  --   --  <5  ALKPHOS  --   --  78  BILITOT  --   --  0.3    RADIOGRAPHIC STUDIES: I have personally reviewed the radiological images as listed and agreed with the findings in the report. MR BRAIN W WO CONTRAST Result Date: 11/21/2024 EXAM: MRI BRAIN WITH AND WITHOUT CONTRAST 11/21/2024 02:03:17 PM TECHNIQUE: Multiplanar multisequence MRI of the head/brain was  performed with and without the administration of 7 mL of gadobutrol  (GADAVIST ) 1 MMOL/ML injection intravenous contrast. COMPARISON: MRI head 03/08/2023. CLINICAL HISTORY: Newly diagnosed stage 4 non small cell lung cancer. Dizziness. FINDINGS: BRAIN AND VENTRICLES: There is no evidence of an acute infarct, intracranial hemorrhage, mass, midline shift, hydrocephalus, or extra-axial fluid collection. Mild generalized cerebral atrophy is within normal limits for age. Scattered T2  hyperintensities in the cerebral white matter bilaterally are similar to the prior MRI and are nonspecific but compatible with mild chronic small vessel ischemic disease. No abnormal enhancement is identified. Major intracranial vascular flow voids are preserved. ORBITS: No significant abnormality. SINUSES: No significant abnormality. BONES AND SOFT TISSUES: Normal bone marrow signal. No soft tissue abnormality. IMPRESSION: 1. No evidence of intracranial metastatic disease. 2. Mild chronic small vessel ischemic disease. Electronically signed by: Dasie Hamburg MD 11/21/2024 02:15 PM EST RP Workstation: [id number]   NM PET Image Initial (PI) Skull Base To Thigh (F-18 FDG) Result Date: 11/09/2024 CLINICAL DATA:  Initial treatment strategy for lung cancer. EXAM: NUCLEAR MEDICINE PET SKULL BASE TO THIGH TECHNIQUE: 9.8 mCi F-18 FDG was injected intravenously. Full-ring PET imaging was performed from the skull base to thigh after the radiotracer. CT data was obtained and used for attenuation correction and anatomic localization. Fasting blood glucose: 96 mg/dl COMPARISON:  CT chest 98/93/7973 and CT left hip 10/25/2024. FINDINGS: Mediastinal blood pool activity: SUV max 4.2 Liver activity: SUV max NA NECK: Asymmetric right nasopharyngeal metabolism without a CT correlate. No hypermetabolic lymph nodes. Incidental CT findings: None. CHEST: Hypermetabolic 8 mm right supraclavicular lymph node, SUV max 19.4. Hypermetabolic bilateral mediastinal and right hilar adenopathy. Index right hilar lymph node measures 2.4 cm, SUV max 22.4. Hypermetabolic right upper lobe mass and nodule measure 3.0 x 3.1 cm (6/35), SUV max 21.6 and 2.4 x 2.8 cm (6/40), SUV max 22.7, respectively. No additional abnormal hypermetabolism. Incidental CT findings: Atherosclerotic calcification of the aorta. Heart is enlarged. No pericardial or pleural effusion. Centrilobular emphysema. ABDOMEN/PELVIS: Hypermetabolic left adrenal mass measures 2.2 x 3.1  cm, SUV max 12.3. Hypermetabolic subcutaneous nodule along the ventral right lateral pelvis measures 10 mm (6/109), SUV max 4.0. No additional abnormal hypermetabolism. Incidental CT findings: Low-attenuation lesions in the kidneys. No specific follow-up necessary. Gastric wall thickening. SKELETON: 9 mm cortical lucent lesion in the volar aspect of the proximal left femur (6/142), SUV max 16.6. No additional abnormal hypermetabolism. Incidental CT findings: Degenerative changes in the spine. IMPRESSION: 1. Stage IV primary bronchogenic carcinoma as evidenced by hypermetabolic right upper lobe nodule and mass, right hilar/bilateral mediastinal/right supraclavicular adenopathy, left adrenal metastasis and proximal left femoral metastasis. 2. Small hypermetabolic subcutaneous nodule along the ventral right lateral pelvis, indeterminate. 3. Gastric wall thickening. 4.  Aortic atherosclerosis (ICD10-I70.0). 5.  Emphysema (ICD10-J43.9). Electronically Signed   By: Newell Eke M.D.   On: 11/09/2024 09:45   DG Chest Port 1 View Result Date: 10/28/2024 EXAM: 1 VIEW(S) XRAY OF THE CHEST 10/28/2024 10:36:00 AM COMPARISON: 10/13/2024 CLINICAL HISTORY: S/P bronchoscopy FINDINGS: LUNGS AND PLEURA: Right apical mass and airspace opacity, similar to recent CT. Diffuse interstitial prominence similar to prior study. No pleural effusion. No pneumothorax. HEART AND MEDIASTINUM: Aortic atherosclerosis. No acute abnormality of the cardiac and mediastinal silhouettes. BONES AND SOFT TISSUES: No acute osseous abnormality. IMPRESSION: 1. Unchanged right apical mass with associated airspace opacity and diffuse interstitial prominence. Electronically signed by: Michaeline Blanch MD 10/28/2024 04:34 PM EST RP Workstation: [id number]   DG C-Arm 1-60 Min-No Report Result  Date: 10/28/2024 Fluoroscopy was utilized by the requesting physician.  No radiographic interpretation.     Cancer of upper lobe of right lung (HCC) # Stage IV  non-small cell lung cancer with metastasis to bone and lymph nodes- DEC 25th, 2025- 1. Negative for acute pulmonary embolus or aortic dissection;  Redemonstrated spiculated right upper lobe pulmonary mass concerning for primary bronchogenic neoplasm. This may be contiguous with additional right suprahilar mass as discussed above. Enlarged right paratracheal and hilar nodes suspect for metastatic disease;  Increased small to moderate right pleural effusion. Trace left-sided pleural effusion;  Multiple small pulmonary nodules measuring up to 5 mm, attention on follow-up imaging. # JAN 6th, 2026-  No significant change in patient's known right upper lobe paramediastinal mass measuring 3.2 x 3.5 cm. This mass is continuous with the known smaller more inferior mass over the right suprahilar region measuring 2.2 x 2.5 cm. Subtle stable thickening of the adjacent interstitium as cannot exclude contiguous lymphangitis spread;  No significant change in mediastinal and right hilar adenopathy;  Several bilateral subcentimeter peripheral pulmonary nodules most notable over the left lower lobe without significant change;  1.4 x 2.1 cm left adrenal nodule appears slightly larger compared to the recent prior exam and is concerning for metastatic disease.; NON-SMALL CELL CARCINOMA [EBUS- Dr.Aleskerov]. There is insufficient cellularity in the cell blocks for  additional testing; # JAN 2026- PET scan:  Stage IV primary bronchogenic carcinoma as evidenced by hypermetabolic right upper lobe nodule and mass, right hilar/bilateral mediastinal/right supraclavicular adenopathy, left adrenal metastasis and proximal left femoral metastasis. Small hypermetabolic subcutaneous nodule along the ventral right lateral pelvis, indeterminate. JAN 2026- MRI Brain- NEG. awaiting PDL-1 levels-; repeat Bx- neck- biopsy- pending.   # Discussed patient will need systemic therapy-which could include chemoimmunotherapy versus immunotherapy alone.  I am  concerned of patient's tolerance to chemo-immunotherapy.  Awaiting PD-L1 testing my preference is to proceed with immunotherapy alone/Keytruda every 3 weeks.  Again is because the survival is anywhere between 1 to 2 years average.  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  # Jan 2026 -left hip lytic lesion - new onset pain due to neoplastic disease- Severe pain in left groin and hip likely due to metastatic proximal femur involvement-status post evaluation with radiation oncology.  Currently planning radiation on February 9.  Recommend adding fentanyl  patch 25 mcg every 3 days.  And also continue half hydrocodone  every 8 hours.  # Poor nutrition difficulty breathing-no worsening cough.  Recommend PET is on 40 mg a day for 1 week and then 20 mg a day.  Patient not diabetic.     # DISPOSITION: # follow up TBD-  Dr.B  # I reviewed the blood work- with the patient in detail; also reviewed the imaging independently [as summarized above]; and with the patient in detail.   Above plan of care was discussed with patient/family in detail.  My contact information was given to the patient/family.     Cindy JONELLE Joe, MD 11/25/2024 2:32 PM

## 2024-11-25 NOTE — Telephone Encounter (Signed)
 Patient for US  guided core RT supraclavicular LN Biopsy on Mon 11/28/24, I called and spoke with the patient's son, Tanda on the phone and gave pre-procedure instructions. Tanda was made aware to be here at 12:30p and check in at the [employer] [facility] [employer]. Tanda stated understanding. Called 11/25/24

## 2024-11-25 NOTE — Progress Notes (Signed)
 Met with patient and her son during follow up visit with Dr. Rennie. Pt did not have any needs or questions at this time. Informed that will follow up on results and coordinate further follow up once results from biopsy and circulogene have been received. Instructed to call with any questions or needs. Pt and son verbalized understanding.

## 2024-11-28 ENCOUNTER — Ambulatory Visit

## 2024-11-29 ENCOUNTER — Ambulatory Visit

## 2024-11-30 ENCOUNTER — Ambulatory Visit

## 2024-12-01 ENCOUNTER — Ambulatory Visit

## 2024-12-02 ENCOUNTER — Ambulatory Visit

## 2024-12-07 ENCOUNTER — Inpatient Hospital Stay
